# Patient Record
Sex: Male | Born: 1990 | Race: Black or African American | Hispanic: No | Marital: Single | Smoking: Current every day smoker
Health system: Southern US, Community
[De-identification: ages and names within clinical notes are randomized; demographics above are authoritative.]

## PROBLEM LIST (undated history)

## (undated) DIAGNOSIS — F32A Depression, unspecified: Secondary | ICD-10-CM

## (undated) DIAGNOSIS — F319 Bipolar disorder, unspecified: Secondary | ICD-10-CM

## (undated) DIAGNOSIS — F329 Major depressive disorder, single episode, unspecified: Secondary | ICD-10-CM

## (undated) HISTORY — DX: Bipolar disorder, unspecified: F31.9

---
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
## 2015-07-02 ENCOUNTER — Emergency Department (HOSPITAL_COMMUNITY): Payer: Self-pay

## 2015-07-02 ENCOUNTER — Encounter (HOSPITAL_COMMUNITY): Payer: Self-pay | Admitting: *Deleted

## 2015-07-02 ENCOUNTER — Emergency Department (HOSPITAL_COMMUNITY)
Admission: EM | Admit: 2015-07-02 | Discharge: 2015-07-03 | Disposition: A | Discharge status: Other Acute Inpatient Hospital | Payer: Self-pay | Attending: Emergency Medicine | Admitting: Emergency Medicine

## 2015-07-02 ENCOUNTER — Encounter (HOSPITAL_COMMUNITY): Payer: Self-pay | Admitting: Emergency Medicine

## 2015-07-02 ENCOUNTER — Ambulatory Visit (HOSPITAL_COMMUNITY)
Admission: RE | Admit: 2015-07-02 | Discharge: 2015-07-02 | Disposition: A | Discharge status: Home / Self Care | Payer: Self-pay | Attending: Psychiatry | Admitting: Psychiatry

## 2015-07-02 DIAGNOSIS — Y998 Other external cause status: Secondary | ICD-10-CM | POA: Insufficient documentation

## 2015-07-02 DIAGNOSIS — S0033XA Contusion of nose, initial encounter: Secondary | ICD-10-CM | POA: Insufficient documentation

## 2015-07-02 DIAGNOSIS — Y9389 Activity, other specified: Secondary | ICD-10-CM | POA: Insufficient documentation

## 2015-07-02 DIAGNOSIS — R4586 Emotional lability: Secondary | ICD-10-CM

## 2015-07-02 DIAGNOSIS — F329 Major depressive disorder, single episode, unspecified: Secondary | ICD-10-CM | POA: Insufficient documentation

## 2015-07-02 DIAGNOSIS — R45851 Suicidal ideations: Secondary | ICD-10-CM

## 2015-07-02 DIAGNOSIS — F1210 Cannabis abuse, uncomplicated: Secondary | ICD-10-CM | POA: Insufficient documentation

## 2015-07-02 DIAGNOSIS — F1010 Alcohol abuse, uncomplicated: Secondary | ICD-10-CM | POA: Insufficient documentation

## 2015-07-02 DIAGNOSIS — F1290 Cannabis use, unspecified, uncomplicated: Secondary | ICD-10-CM | POA: Insufficient documentation

## 2015-07-02 DIAGNOSIS — F3489 Other specified persistent mood disorders: Secondary | ICD-10-CM | POA: Insufficient documentation

## 2015-07-02 DIAGNOSIS — Y9289 Other specified places as the place of occurrence of the external cause: Secondary | ICD-10-CM | POA: Insufficient documentation

## 2015-07-02 DIAGNOSIS — X838XXA Intentional self-harm by other specified means, initial encounter: Secondary | ICD-10-CM | POA: Insufficient documentation

## 2015-07-02 DIAGNOSIS — S0083XA Contusion of other part of head, initial encounter: Secondary | ICD-10-CM | POA: Insufficient documentation

## 2015-07-02 DIAGNOSIS — F332 Major depressive disorder, recurrent severe without psychotic features: Secondary | ICD-10-CM | POA: Insufficient documentation

## 2015-07-02 HISTORY — DX: Depression, unspecified: F32.A

## 2015-07-02 HISTORY — DX: Major depressive disorder, single episode, unspecified: F32.9

## 2015-07-02 LAB — COMPREHENSIVE METABOLIC PANEL
ALT: 16 U/L — ABNORMAL LOW (ref 17–63)
ANION GAP: 10 (ref 5–15)
AST: 28 U/L (ref 15–41)
Albumin: 4.3 g/dL (ref 3.5–5.0)
Alkaline Phosphatase: 48 U/L (ref 38–126)
BUN: 10 mg/dL (ref 6–20)
CHLORIDE: 107 mmol/L (ref 101–111)
CO2: 23 mmol/L (ref 22–32)
Calcium: 9.1 mg/dL (ref 8.9–10.3)
Creatinine, Ser: 1.15 mg/dL (ref 0.61–1.24)
GFR calc non Af Amer: >60 mL/min (ref >60)
Glucose, Bld: 82 mg/dL (ref 65–99)
Potassium: 4.2 mmol/L (ref 3.5–5.1)
SODIUM: 140 mmol/L (ref 135–145)
Total Bilirubin: 0.9 mg/dL (ref 0.3–1.2)
Total Protein: 7.9 g/dL (ref 6.5–8.1)

## 2015-07-02 LAB — ETHANOL: Alcohol, Ethyl (B): 55 mg/dL — ABNORMAL HIGH (ref <5)

## 2015-07-02 LAB — CBC
HCT: 43 % (ref 39.0–52.0)
HEMOGLOBIN: 14.7 g/dL (ref 13.0–17.0)
MCH: 32.7 pg (ref 26.0–34.0)
MCHC: 34.2 g/dL (ref 30.0–36.0)
MCV: 95.6 fL (ref 78.0–100.0)
Platelets: 335 10*3/uL (ref 150–400)
RBC: 4.5 MIL/uL (ref 4.22–5.81)
RDW: 12.2 % (ref 11.5–15.5)
WBC: 5.7 10*3/uL (ref 4.0–10.5)

## 2015-07-02 LAB — URINE MICROSCOPIC-ADD ON

## 2015-07-02 LAB — URINALYSIS, ROUTINE W REFLEX MICROSCOPIC
BILIRUBIN URINE: NEGATIVE
Glucose, UA: NEGATIVE mg/dL
Ketones, ur: NEGATIVE mg/dL
Leukocytes, UA: NEGATIVE
NITRITE: NEGATIVE
PH: 5.5 (ref 5.0–8.0)
Protein, ur: NEGATIVE mg/dL
SPECIFIC GRAVITY, URINE: 1.027 (ref 1.005–1.030)
Urobilinogen, UA: 1 mg/dL (ref 0.0–1.0)

## 2015-07-02 LAB — RAPID URINE DRUG SCREEN, HOSP PERFORMED
AMPHETAMINES: NOT DETECTED
Barbiturates: NOT DETECTED
Benzodiazepines: NOT DETECTED
COCAINE: NOT DETECTED
OPIATES: NOT DETECTED
TETRAHYDROCANNABINOL: POSITIVE — AB

## 2015-07-02 LAB — SALICYLATE LEVEL

## 2015-07-02 LAB — ACETAMINOPHEN LEVEL

## 2015-07-02 MED ORDER — IBUPROFEN 200 MG PO TABS
600.0000 mg | ORAL_TABLET | Freq: Four times a day (QID) | ORAL | Status: DC | PRN
  Administered 2015-07-02 – 2015-07-03 (×2): 600 mg via ORAL
  Filled 2015-07-02 (×2): qty 3

## 2015-07-02 MED ORDER — IBUPROFEN 200 MG PO TABS: 600.0000 mg | ORAL_TABLET | Freq: Three times a day (TID) | ORAL | Status: DC | PRN

## 2015-07-02 MED ORDER — ACETAMINOPHEN 325 MG PO TABS: 650.0000 mg | ORAL_TABLET | ORAL | Status: DC | PRN

## 2015-07-02 MED ORDER — ZOLPIDEM TARTRATE 5 MG PO TABS: 5.0000 mg | ORAL_TABLET | Freq: Every evening | ORAL | Status: DC | PRN

## 2015-07-02 MED ORDER — ONDANSETRON HCL 4 MG PO TABS
4.0000 mg | ORAL_TABLET | Freq: Three times a day (TID) | ORAL | Status: DC | PRN
Start: 2015-07-02 — End: 2015-07-03

## 2015-07-02 MED ORDER — LORAZEPAM 1 MG PO TABS: 1.0000 mg | ORAL_TABLET | Freq: Three times a day (TID) | ORAL | Status: DC | PRN

## 2015-07-02 MED ORDER — ALUM & MAG HYDROXIDE-SIMETH 200-200-20 MG/5ML PO SUSP
30.0000 mL | ORAL | Status: DC | PRN
Start: 2015-07-02 — End: 2015-07-03

## 2015-07-02 MED ORDER — NICOTINE 21 MG/24HR TD PT24
21.0000 mg | MEDICATED_PATCH | Freq: Every day | TRANSDERMAL | Status: DC
  Administered 2015-07-03: 21 mg via TRANSDERMAL
  Filled 2015-07-02: qty 1

## 2015-07-02 NOTE — BH Assessment (Signed)
Tele Assessment Note   Darrell [name] is a 24 y.o. male who presents as a walk in to [facility], accompanied by his mother and sister. Pt is crying and has poor eye contact, stating that he is stressed because he is unemployed, has financial problems, has legal problems and lives with his girlfriend and cannot provide for her.  Pt states he and his girlfriend had a discussion today that was emotional and he lost control.  He says that he started smashing his face against the wall several times to harm himself and cut his hand--"I didn't know I did it until I felt it".  Pt admits SI thoughts "off and on" since he was 24 yrs old, he says he has no specific plan in mind but says he doesn't trust himself because he's not sure what he will do.  Pt states that he wants to be safe for his girlfriend. Pt says he is also sad about his parents divorce and that his girlfriend had an abortion and feels like he should have stopped he.  Pt denies HI/AVH, he says he drinks alcohol at least 3-6 24oz beers, 2-5 days a week and smokes a varied amount of marijuana weekly.  His last use was few days ago.    Per pt.'s mother and sister the confirm that pt.'s mood has worsened and they witnessed him attempting to hang himself approx 1 yr ago.  Pt.'s mother is crying and apologizing because she didn't seek help sooner for her son.  Pt.'s mother/sister say when pt gets very angry, he lashes out by breaking things.  Pt says that he tends to repeat his mistakes and doesn't learn from them and told this writer he couldn't look up because he was ashamed of himself. This Clinical research associate discussed disposition with Donell Sievert, PA who recommends medical clearance and inpt admission.  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 Depression     No past surgical history on  file.  Family History: No family history on file.  Social History:  reports that he drinks alcohol. He reports that he uses illicit drugs (Marijuana). His tobacco history is not on file.  Additional Social History:  Alcohol / Drug Use Pain Medications: None  Prescriptions: None Over the Counter: None  History of alcohol / drug use?: Yes Longest period of sobriety (when/how long): None  Negative Consequences of Use: Work / Programmer, multimedia, Copywriter, advertising relationships, Armed forces operational officer, Surveyor, quantity Withdrawal Symptoms: Other (Comment) (No w/d sxs ) Substance #1 Name of Substance 1: Alcohol  1 - Age of First Use: Teens  1 - Amount (size/oz): 3-6 24oz 1 - Frequency: 2-5 Days a Week  1 - Duration: On-going  1 - Last Use / Amount: 07/02/15 Substance #2 Name of Substance 2: THC  2 - Age of First Use: Teens  2 - Amount (size/oz): Varies  2 - Frequency: "Off and On" 2 - Duration: On-going  2 - Last Use / Amount: "A few days ago"  CIWA:   COWS:    PATIENT STRENGTHS: (choose at least two) Manufacturing systems engineer Motivation for treatment/growth Supportive family/friends  Allergies: No Known Allergies  Home Medications:  (Not in a hospital admission)  OB/GYN Status:  No LMP for male patient.  General Assessment Data Location of Assessment: [facility] TTS Assessment: In system Is this a Tele or Face-to-Face Assessment?:  Face-to-Face Is this an Initial Assessment or a Re-assessment for this encounter?: Initial Assessment Marital status: Single Maiden name: None  Is patient pregnant?: No Pregnancy Status: No Living Arrangements: Non-relatives/Friends (Lives with girlfriend ) Can pt return to current living arrangement?: Yes Admission Status: Voluntary Is patient capable of signing voluntary admission?: Yes Referral Source: Self/Family/Friend Insurance type: SP  Medical Screening Exam [facility] Walk-in ONLY) Medical Exam completed: No Reason for MSE not completed: Other: ([facility] )  Crisis Care Plan Living Arrangements: Non-relatives/Friends (Lives with girlfriend ) Name of Psychiatrist: None  Name of Therapist: None   Education Status Is patient currently in school?: No Current Grade: None  Highest grade of school patient has completed: None  Name of school: None  Contact person: None   Risk to self with the past 6 months Suicidal Ideation: Yes-Currently Present Has patient been a risk to self within the past 6 months prior to admission? : Yes Suicidal Intent: No-Not Currently/Within Last 6 Months Has patient had any suicidal intent within the past 6 months prior to admission? : No Is patient at risk for suicide?: Yes Suicidal Plan?: No-Not Currently/Within Last 6 Months Has patient had any suicidal plan within the past 6 months prior to admission? : No Access to Means: Yes Specify Access to Suicidal Means: Sharps, Belts  What has been your use of drugs/alcohol within the last 12 months?: Pt using alcohol and THC  Previous Attempts/Gestures: Yes How many times?: 1 Other Self Harm Risks: None  Triggers for Past Attempts: Other personal contacts, Unpredictable Intentional Self Injurious Behavior: Damaging Comment - Self Injurious Behavior: Per pt and mom, he hots things that cause physical damage to his hands  Family Suicide History: No Recent stressful life event(s): Conflict (Comment), Job Loss, Financial Problems, Legal Issues (Pls see EPIC note ) Persecutory voices/beliefs?: No Depression: Yes Depression Symptoms: Tearfulness, Isolating, Insomnia, Loss of interest in usual pleasures, Feeling worthless/self pity, Feeling angry/irritable Substance abuse history and/or treatment for substance abuse?: Yes Suicide prevention information given to non-admitted patients: Not applicable  Risk to Others within the past 6 months Homicidal Ideation: No Does patient have any lifetime risk of violence toward others beyond the six months prior to admission?  : No Thoughts of Harm to Others: No Current Homicidal Intent: No Current Homicidal Plan: No Access to Homicidal Means: No Identified Victim: None  History of harm to others?: No Assessment of Violence: None Noted Violent Behavior Description: Nnoe  Does patient have access to weapons?: No Criminal Charges Pending?: No Does patient have a court date: No Is patient on probation?: No  Psychosis Hallucinations: None noted Delusions: None noted  Mental Status Report Appearance/Hygiene: Disheveled Eye Contact: Poor Motor Activity: Unremarkable Speech: Logical/coherent, Soft Level of Consciousness: Alert, Crying Mood: Depressed, Sad, Helpless Affect: Depressed, Sad, Flat Anxiety Level: None Thought Processes: Coherent, Relevant Judgement: Impaired Orientation: Person, Place, Time, Situation Obsessive Compulsive Thoughts/Behaviors: None  Cognitive Functioning Concentration: Normal Memory: Recent Intact, Remote Intact IQ: Average Insight: Poor Impulse Control: Poor Appetite: Fair Weight Loss: 0 Weight Gain: 0 Sleep: Decreased Total Hours of Sleep:  (2 hrs in the last 24 hrs ) Vegetative Symptoms: None  ADLScreening [facility] Assessment Services) Patient's cognitive ability adequate to safely complete daily activities?: Yes Patient able to express need for assistance with ADLs?: Yes Independently performs ADLs?: Yes (appropriate for developmental age)  Prior Inpatient Therapy Prior Inpatient Therapy: No Prior Therapy Dates: None  Prior Therapy Facilty/Provider(s): None  Reason for  Treatment: None   Prior Outpatient Therapy Prior Outpatient Therapy: No Prior Therapy Dates: None  Prior Therapy Facilty/Provider(s): None  Reason for Treatment: None  Does patient have an ACCT team?: No Does patient have Intensive In-House Services?  : No Does patient have [facility] services? : No Does patient have P4CC services?: No  ADL Screening (condition at time of  admission) Patient's cognitive ability adequate to safely complete daily activities?: Yes Is the patient deaf or have difficulty hearing?: No Does the patient have difficulty seeing, even when wearing glasses/contacts?: No Does the patient have difficulty concentrating, remembering, or making decisions?: No Patient able to express need for assistance with ADLs?: Yes Does the patient have difficulty dressing or bathing?: No Independently performs ADLs?: Yes (appropriate for developmental age) Does the patient have difficulty walking or climbing stairs?: No Weakness of Legs: None Weakness of Arms/Hands: None  Home Assistive Devices/Equipment Home Assistive Devices/Equipment: None  Therapy Consults (therapy consults require a physician order) PT Evaluation Needed: No OT Evalulation Needed: No SLP Evaluation Needed: No Abuse/Neglect Assessment (Assessment to be complete while patient is alone) Physical Abuse: Denies Verbal Abuse: Denies Sexual Abuse: Denies Exploitation of patient/patient's resources: Denies Self-Neglect: Denies Values / Beliefs Cultural Requests During Hospitalization: None Spiritual Requests During Hospitalization: None Consults Spiritual Care Consult Needed: No Social Work Consult Needed: No Merchant navy officer (For Healthcare) Does patient have an advance directive?: No Would patient like information on creating an advanced directive?: No - patient declined information    Additional Information 1:1 In Past 12 Months?: No CIRT Risk: No Elopement Risk: No Does patient have medical clearance?: No ([facility] )     Disposition:  Disposition Initial Assessment Completed for this Encounter: Yes Disposition of Patient: Inpatient treatment program, Referred to (Per Donell Sievert, PA meets criteria for inpt admission ) Type of inpatient treatment program: Adult Patient referred to: Other (Comment) (Per Donell Sievert, PA meets criteria  for inpt admission )  Murrell Redden 07/02/2015 9:49 PM

## 2015-07-02 NOTE — ED Notes (Signed)
Pt belonging: nike slide on, blue jeans, black shorts, black sock, black shirt, black cell phone(screen is broke)

## 2015-07-02 NOTE — ED Notes (Addendum)
Patient presents to the ED after attempting to commit suicide by ramming his face repeatedly into a wall. The patient reports that he "finally exploded" and that this was not the first time he has attempted to harm himself but that it is the "worst". He does not endorse a previous psychiatric history and took Hydrocodone for a series of sports injuries and Adderall. Patient drinks 2 to 5 times a week and smokes marijuana socially. He currently lives with girlfriend and has lived there for three years. He currently wishes to figure out why he keeps causing harm to himself even though he doesn't want to cause others pain.   Patient contracts for safety during inpatient stay, denies HI and says he has pain rated a 6/10 in his nose where he has an abrasion. He also has an abrasion on his left hand. Nurse gave ibuprofen 600 mg for pain. Patient appeared depressed and sad upon assessment with minimal eye contact. Patient does not appear to be in distress at this time and has no other complaints. Every 15 minute checks completed for safety.

## 2015-07-02 NOTE — ED Provider Notes (Signed)
CSN: [id number]     Arrival date & time 07/02/15  2108 History  By signing my name below, I, Darrell [name], attest that this documentation has been prepared under the direction and in the presence of Darrell Pel, PA-C. Electronically Signed: Octavia [name], ED Scribe. 07/02/2015. 9:58 PM.    Chief Complaint  Patient presents with  . Suicidal     The history is provided by the patient. No language interpreter was used.   HPI Comments: Darrell [name] is a 24 y.o. male who presents to the Emergency Department complaining of constant, gradual worsening depression with associated SI onset around 6am this morning. Pt states he has been more depressed than usual lately. He reports he smashed his face into a wall multiple times out of anger. He reports there was a discussion between him and his girlfriend today that led him to become very frustrated and he started hurting himself. He notes he has never done something like this before to this extent, but his mom says that his mother states that the pt has been "angry all of his life". She states pt has been doing this since he was younger and he has always wanted to harm himself and kill himself when he was younger. The mom tells [name] that she realized she should have got him help sooner.  She reports frequently walking in on him in different rooms trying to hang himself. After she or his sister would get the rope from around his neck, they would let the patient go play video games. Mom is sincere and tearful. She has an accent from another country and reports she generally thought he would get better and did not know what to do.   Pt reports he feels incredibly happy one day and then the next he feels like it could be the end of the world. He notes talking to a counselor multiple times (5 visits at therapist at [facility]) but reports it did not last very long. He has been given the number to the suicide hotline but has yet to use it.He reports etoh use 2-5x a  week, recreational marijuana occasionally but not today. He denies HI. Denies hallucinations or delusions. Pt also cut his left hand. He is here voluntarily. Was sent from [facility] for Medical clearance.  Past Medical History  Diagnosis Date  . Depression    History reviewed. No pertinent past surgical history. History reviewed. No pertinent family history. Social History  Substance Use Topics  . Smoking status: None  . Smokeless tobacco: None  . Alcohol Use: Yes     Comment: Drinks 2-5 days a week    Review of Systems  HENT: Positive for facial swelling.   Psychiatric/Behavioral: Positive for suicidal ideas.  All other systems reviewed and are negative.     Allergies  Review of patient's allergies indicates no known allergies.  Home Medications   Prior to Admission medications   Medication Sig Start Date End Date Taking? Authorizing Provider  ibuprofen (ADVIL,MOTRIN) 200 MG tablet Take 400 mg by mouth every 6 (six) hours as needed for moderate pain.   Yes Historical Provider, MD   Triage vitals: BP 131/78 mmHg  Pulse 79  Temp(Src) 98.1 F (36.7 C) (Oral)  Resp 20  Ht  (1.905 m)  Wt 220 lb (99.791 kg)  BMI 27.50 kg/m2  SpO2 100% Physical Exam  Constitutional: He appears well-developed and well-nourished. No distress.  HENT:  Head: Normocephalic. Head is with abrasion and with contusion.  Head is without raccoon's eyes, without Battle's sign, without laceration, without right periorbital erythema and without left periorbital erythema. Hair is normal.  Right Ear: Tympanic membrane and ear canal normal.  Left Ear: Tympanic membrane and ear canal normal.  Nose: Sinus tenderness (significant swelling to nasal bridge) present. No septal deviation or nasal septal hematoma. No epistaxis. Right sinus exhibits frontal sinus tenderness. Right sinus exhibits no maxillary sinus tenderness. Left sinus exhibits frontal sinus tenderness. Left sinus exhibits no maxillary sinus  tenderness.  Mouth/Throat: Uvula is midline and oropharynx is clear and moist.  Eyes: Right eye exhibits no discharge. Left eye exhibits no discharge.  Neck: No spinous process tenderness and no muscular tenderness present.  Pulmonary/Chest: Effort normal. No respiratory distress.  Neurological: He is alert. Coordination normal.  Skin: No rash noted. He is not diaphoretic.  Psychiatric: His speech is normal and behavior is normal. He is not actively hallucinating. Thought content is not paranoid and not delusional. He exhibits a depressed mood. He expresses suicidal ideation. He expresses no homicidal ideation. He expresses suicidal plans. He expresses no homicidal plans.  Nursing note and vitals reviewed.   ED Course  Procedures  DIAGNOSTIC STUDIES: Oxygen Saturation is 100% on RA, normal by my interpretation.  COORDINATION OF CARE:  9:53 PM Discussed treatment plan which includes imaging of face with pt at bedside and pt agreed to plan.  Labs Review Labs Reviewed  COMPREHENSIVE METABOLIC PANEL - Abnormal; Notable for the following:    ALT 16 (*)    All other components within normal limits  ETHANOL - Abnormal; Notable for the following:    Alcohol, Ethyl (B) 55 (*)    All other components within normal limits  ACETAMINOPHEN LEVEL - Abnormal; Notable for the following:    Acetaminophen (Tylenol), Serum <10 (*)    All other components within normal limits  SALICYLATE LEVEL  CBC  URINE RAPID DRUG SCREEN, HOSP PERFORMED  URINALYSIS, ROUTINE W REFLEX MICROSCOPIC (NOT AT [facility])    Imaging Review No results found. I have personally reviewed and evaluated these images and lab results as part of my medical decision-making.   EKG Interpretation None      MDM   Final diagnoses:  Mood swings (HCC)  Suicidal ideation  Self-harm, initial encounter  Nasal contusion, initial encounter    10: 32 pm Psych holding orders placed Home meds reviewed and ordered as appropriate TTS  consult ordered Considered CIWA protocol and ordered when appropriate. Labs resulted showing mldly elevated ETOH. No UDS yet but otherwise unremarkable. CT maxfac pending  Filed Vitals:   07/02/15 2119  BP: 131/78  Pulse: 79  Temp: 98.1 F (36.7 C)  Resp: 20   11: 45 pm - medically cleared. Anticipate inpatient.   Darrell Peliffany Elysia Grand, PA-C 07/02/15 2345  Lorre NickAnthony Allen, MD 07/04/15 [phone]

## 2015-07-02 NOTE — ED Notes (Signed)
Patient says he feels depressed and he do not know how. Patient stated he smashed his face in the wall today because he was trying make hisself die. Patient mother says he is always trying to do this and she do not know why either.

## 2015-07-02 NOTE — ED Notes (Signed)
Patient requested to urinate. 

## 2015-07-03 ENCOUNTER — Inpatient Hospital Stay (HOSPITAL_COMMUNITY)
Admission: AD | Admit: 2015-07-03 | Discharge: 2015-07-13 | DRG: 885 | Disposition: A | Discharge status: Home / Self Care | Payer: Federal, State, Local not specified - Other | Source: Intra-hospital | Attending: Psychiatry | Admitting: Psychiatry

## 2015-07-03 ENCOUNTER — Encounter (HOSPITAL_COMMUNITY): Payer: Self-pay

## 2015-07-03 DIAGNOSIS — F1020 Alcohol dependence, uncomplicated: Secondary | ICD-10-CM | POA: Diagnosis present

## 2015-07-03 DIAGNOSIS — R45851 Suicidal ideations: Secondary | ICD-10-CM | POA: Diagnosis present

## 2015-07-03 DIAGNOSIS — F332 Major depressive disorder, recurrent severe without psychotic features: Secondary | ICD-10-CM | POA: Diagnosis present

## 2015-07-03 DIAGNOSIS — F17210 Nicotine dependence, cigarettes, uncomplicated: Secondary | ICD-10-CM | POA: Diagnosis present

## 2015-07-03 DIAGNOSIS — F322 Major depressive disorder, single episode, severe without psychotic features: Secondary | ICD-10-CM | POA: Diagnosis present

## 2015-07-03 DIAGNOSIS — F1010 Alcohol abuse, uncomplicated: Secondary | ICD-10-CM | POA: Diagnosis not present

## 2015-07-03 MED ORDER — ONDANSETRON HCL 4 MG PO TABS: 4.0000 mg | ORAL_TABLET | Freq: Three times a day (TID) | ORAL | Status: DC | PRN

## 2015-07-03 MED ORDER — ACETAMINOPHEN 325 MG PO TABS
650.0000 mg | ORAL_TABLET | ORAL | Status: DC | PRN
  Administered 2015-07-03 – 2015-07-07 (×3): 650 mg via ORAL
  Filled 2015-07-03 (×3): qty 2

## 2015-07-03 MED ORDER — NICOTINE 21 MG/24HR TD PT24
21.0000 mg | MEDICATED_PATCH | Freq: Every day | TRANSDERMAL | Status: DC
  Administered 2015-07-04 – 2015-07-13 (×11): 21 mg via TRANSDERMAL
  Filled 2015-07-03 (×13): qty 1

## 2015-07-03 MED ORDER — ZOLPIDEM TARTRATE 5 MG PO TABS: 5.0000 mg | ORAL_TABLET | Freq: Every evening | ORAL | Status: DC | PRN

## 2015-07-03 MED ORDER — IBUPROFEN 600 MG PO TABS: 600.0000 mg | ORAL_TABLET | Freq: Three times a day (TID) | ORAL | Status: DC | PRN

## 2015-07-03 MED ORDER — IBUPROFEN 600 MG PO TABS
600.0000 mg | ORAL_TABLET | Freq: Four times a day (QID) | ORAL | Status: DC | PRN
  Administered 2015-07-03 – 2015-07-09 (×11): 600 mg via ORAL
  Filled 2015-07-03 (×11): qty 1

## 2015-07-03 MED ORDER — LORAZEPAM 1 MG PO TABS
1.0000 mg | ORAL_TABLET | Freq: Three times a day (TID) | ORAL | Status: DC | PRN
  Administered 2015-07-03 – 2015-07-04 (×2): 1 mg via ORAL
  Filled 2015-07-03 (×2): qty 1

## 2015-07-03 MED ORDER — TRAZODONE HCL 50 MG PO TABS
50.0000 mg | ORAL_TABLET | Freq: Every evening | ORAL | Status: DC | PRN
  Administered 2015-07-03 – 2015-07-04 (×2): 50 mg via ORAL
  Filled 2015-07-03 (×2): qty 1

## 2015-07-03 MED ORDER — ALUM & MAG HYDROXIDE-SIMETH 200-200-20 MG/5ML PO SUSP: 30.0000 mL | ORAL | Status: DC | PRN

## 2015-07-03 NOTE — Progress Notes (Signed)
Adult Psychoeducational Group Note  Date:  07/03/2015 Time:  9:36 PM  Group Topic/Focus:  Wrap-Up Group:   The focus of this group is to help patients review their daily goal of treatment and discuss progress on daily workbooks.  Participation Level:  Active  Participation Quality:  Appropriate and Attentive  Affect:  Appropriate  Cognitive:  Appropriate  Insight: Appropriate  Engagement in Group:  Engaged  Modes of Intervention:  Discussion  Additional Comments:  Pt stated his day was a 5/10. Pt stated his goal for today was to tell people what was wrong instead of just keeping it inside. Pt stated his positive thing for today was that he was able to see his mother. Caswell CorwinOwen, Jaccob Czaplicki C 07/03/2015, 9:36 PM

## 2015-07-03 NOTE — Progress Notes (Signed)
Admit to the adult unit from obs: Documents and policies reviewed with pt. Pt verbalized understanding of polices and unit rules. Pt stated that he was stressed and banged his head on the wall at least four times. Pt noted to have lateral scrapes to nose;cut to upper lip & L inner wrist. Pt anxious on admit. Pt reported pain to bridge of nose. Pt given Ativan for anxiety and Motrin for pain. Pt is a very polite young man. Pt presents with flat affect and depressed mood. Pt appears sad and hopeless. Pt denies suicidal thoughts at this time. Pt verbally contracts for safety. Pt denies withdrawal symptoms.

## 2015-07-03 NOTE — BH Assessment (Signed)
[facility] Assessment Progress Note  Per Thedore MinsMojeed Akintayo, MD, this pt [facility].  [name], RN, [facility] to Rm Obs 4.  Pt has signed Voluntary Admission and Consent for Treatment, as well as Consent to Release Information to no one, and signed [facility].  Pt's nurse has been notified, and agrees to send original paperwork along with pt via [employer], and to call report to [phone] or [phone].  Doylene Canninghomas Sydelle Sherfield, MA Triage Specialist [phone]

## 2015-07-03 NOTE — Progress Notes (Signed)
Nursing Admission Assessment:  Patient is Voluntary Admission to Observation Unit after presenting to [facility] accompanied by his Mother and Sister. Patient admits to longtime depression since his teens with SI "off and on". A "heated discussion" between he and his girlfriend, with whom he lives, precipitated this admission, as patient began banging his face against the wall. Patient does has lateral scrapes across the bridge of his nose, a 1 cm cut to his right upper lip and an approximately 3/4 inch laceration to his left inner wrist covered with a bandaid. No site has active drainage or edema. Patient admits to rages where he destroys property, both when intoxicated and when sober according to patient. Patient admits to drinking beer 3 to 5 days a week, 1 to 6 beers each day. Patient states he rarely drinks more than his regular amount, and does smoke cannabis about once a week. Patient is cooperative, quiet, states he is not comfortable expressing his feelings or emotions even to his girlfriend. Patient currently denies any SI/HI/AVH and verbally contracts for safety regarding SI and also for any rages and/or impulse to destroy property. Q 15 minute checks for safety continuous.

## 2015-07-03 NOTE — ED Notes (Addendum)
[facility]. Denies SI/HI/AVH. Discharged with [facility] and belongings given to [employer]. [facility]. Denies pain.

## 2015-07-04 DIAGNOSIS — F332 Major depressive disorder, recurrent severe without psychotic features: Principal | ICD-10-CM

## 2015-07-04 DIAGNOSIS — F1010 Alcohol abuse, uncomplicated: Secondary | ICD-10-CM

## 2015-07-04 MED ORDER — FLUOXETINE HCL 20 MG PO CAPS
20.0000 mg | ORAL_CAPSULE | Freq: Every day | ORAL | Status: DC
  Administered 2015-07-04 – 2015-07-12 (×9): 20 mg via ORAL
  Filled 2015-07-04 (×12): qty 1

## 2015-07-04 MED ORDER — LORAZEPAM 1 MG PO TABS
1.0000 mg | ORAL_TABLET | Freq: Three times a day (TID) | ORAL | Status: DC | PRN
  Administered 2015-07-04 – 2015-07-10 (×12): 1 mg via ORAL
  Filled 2015-07-04 (×12): qty 1

## 2015-07-04 NOTE — Progress Notes (Signed)
D: Darrell [name] states his goal for today was to see his family, talk to more people on the unit and not be so isolative. He was able to accomplish his goals. He rates Anxiety 5/10 Depression 4/10 Anger 0/10. He denies SI/HI/AVH and contracts for safety.  A: Encouragement and support given.  R: Continue to monitor for patient safety and medication effectiveness.

## 2015-07-04 NOTE — H&P (Signed)
Psychiatric Admission Assessment Adult  Patient Identification: Darrell [name] MRN:  [id number] Date of Evaluation:  07/04/2015 Chief Complaint:  " I smashed my face because I was very upset" Principal Diagnosis: Major Depression, Severe, no Psychotic symptoms  Diagnosis:   Patient Active Problem List   Diagnosis Date Noted  . Alcohol use disorder, severe, dependence ([place]) [F10.20] 07/03/2015   History of Present Illness:: 24 year old man, states he recently had an argument with his GF , and states " I don't really remember much of it ". States that he felt increasingly upset and angry and  states " I ended up smashing my face against the wall a few times ". (  Patient has  some bruises and lacerations on face ) . Patient states he also had some passive suicidal ideations, wishing he were dead .  He later told his mother, who was concerned about this event, and was brought to the hospital. Patient attributes his agitation and explosiveness to insomnia , and states he had not slept well for  A few days due to depression and anxiety. Patient describes a history of chronic depression, and states he has felt depressed for several years .   Patient also describes a history of alcohol abuse and states he has been drinking 4-5 times per week, at times to gross intoxication. Patient states, however, that his alcohol consumption pattern has decreased and that he is drinking much less than before- he states alcohol " is a way for me to self medicate" to manage anxiety and depression.   Associated Signs/Symptoms: Depression Symptoms:  depressed mood, anhedonia, insomnia, recurrent thoughts of death, anxiety, loss of energy/fatigue, decreased appetite, reports low self esteem (Hypo) Manic Symptoms:  Does not currently endorse symptoms of mania or hypomania- states does have brief episodes of feeling " hyper" usually lasting only a few minutes  Anxiety Symptoms:  States he worries excessively ,  particularly about  Relationships, finances  Psychotic Symptoms:  Denies psychotic symptoms PTSD Symptoms: Does not endorse  Total Time spent with patient: 45 minutes  Past Psychiatric History: this is first psychiatric admission, states he wrapped a cord around neck 4-5 years ago, but " could not go through it".  Denies any history of self cutting. Describes excessive anxiety, related to psychosocial stressors. Describes occasional panic attacks. No psychosis, no clear history of mania/hypomania. Denies any history of violence. States he has not been on any psychiatric medications .   Risk to Self: Is patient at risk for suicide?: No What has been your use of drugs/alcohol within the last 12 months?: Pt reports that he is drinking 2-5 days a week, 2-6 22oz beers at that time; recreational use of THC Risk to Others:   Prior Inpatient Therapy:   Prior Outpatient Therapy:    Alcohol Screening: 1. How often do you have a drink containing alcohol?: 4 or more times a week 2. How many drinks containing alcohol do you have on a typical day when you are drinking?: 5 or 6 3. How often do you have six or more drinks on one occasion?: Monthly Preliminary Score: 4 4. How often during the last year have you found that you were not able to stop drinking once you had started?: Less than monthly 5. How often during the last year have you failed to do what was normally expected from you becasue of drinking?: Never 6. How often during the last year have you needed a first drink in the morning to  get yourself going after a heavy drinking session?: Never 7. How often during the last year have you had a feeling of guilt of remorse after drinking?: Weekly 8. How often during the last year have you been unable to remember what happened the night before because you had been drinking?: Less than monthly 9. Have you or someone else been injured as a result of your drinking?: Yes, during the last year (Patient's  tendency is to destroy objects which he often incurs an injury when engaging in this destruction.) 10. Has a relative or friend or a doctor or another health worker been concerned about your drinking or suggested you cut down?: Yes, during the last year Alcohol Use Disorder Identification Test Final Score (AUDIT): 21 Brief Intervention: Yes Substance Abuse History in the last 12 months:   Describes history of alcohol abuse but states " I have slowed down a lot ", and now drinks 4-5 x per week, usually 1-5 beers . Smokes cannabis 1-2 per week.  Denies any other drug abuse . Consequences of Substance Abuse: (+) blackouts in the past, history of legal difficulties for selling liquor to a minor in the past . No DUI, no history of seizures Previous Psychotropic Medications: Denies  Psychological Evaluations: No  Past Medical History: Denies medical issues , NKDA, smokes 1/2 PPD  Past Medical History  Diagnosis Date  . Depression    History reviewed. No pertinent past surgical history. Family History:  Parents alive, divorced, has an improved relationship with father,  Two sisters. Family Psychiatric  History:  Mother has history of alcohol abuse in the past, states some family members have depression, denies suicides in family.  Social History:  Single, no children, lives with GF, currently unemployed, no current source of income , has some legal issues, related to " paying court costs " in order to get license .  History  Alcohol Use  . Yes    Comment: Drinks 2-5 days a week     History  Drug Use  . Yes  . Special: Marijuana    Comment: Uses "off and on"    Social History   Social History  . Marital Status: Single    Spouse Name: N/A  . Number of Children: N/A  . Years of Education: N/A   Social History Main Topics  . Smoking status: Current Every Day Smoker -- 1.00 packs/day    Types: Cigarettes  . Smokeless tobacco: None  . Alcohol Use: Yes     Comment: Drinks 2-5 days a week   . Drug Use: Yes    Special: Marijuana     Comment: Uses "off and on"  . Sexual Activity: Not Asked   Other Topics Concern  . None   Social History Narrative   Additional Social History:    Pain Medications: n/a Prescriptions: n/a Over the Counter: n/a History of alcohol / drug use?: Yes Longest period of sobriety (when/how long): 8 or 9 months in 2013 Negative Consequences of Use: Personal relationships, Financial Withdrawal Symptoms: Irritability, Aggressive/Assaultive ("If it's been a couple of days and I haven't had anything to drinkn") Name of Substance 1: Beer 1 - Age of First Use: 11  years 1 - Amount (size/oz): Equivalent of up to 6 beers per day 1 - Frequency: 2 to 5 days per week 1 - Duration: Several hours, mostly at night 1 - Last Use / Amount: 07/02/2015 Name of Substance 2: Cannabis 2 - Age of First Use: approx 24 years  old 2 - Amount (size/oz): States only "casually" if a friend has some weed; that he never purchases it 2 - Frequency: Approximately once a week 2 - Duration: A few hours or hits? 2 - Last Use / Amount: "A few days ago".  Allergies:  No Known Allergies Lab Results:  Results for orders placed or performed during the hospital encounter of 07/02/15 (from the past 48 hour(s))  Comprehensive metabolic panel     Status: Abnormal   Collection Time: 07/02/15  9:37 PM  Result Value Ref Range   Sodium 140 135 - 145 mmol/L   Potassium 4.2 3.5 - 5.1 mmol/L   Chloride 107 101 - 111 mmol/L   CO2 23 22 - 32 mmol/L   Glucose, Bld 82 65 - 99 mg/dL   BUN 10 6 - 20 mg/dL   Creatinine, Ser 1.15 0.61 - 1.24 mg/dL   Calcium 9.1 8.9 - 10.3 mg/dL   Total Protein 7.9 6.5 - 8.1 g/dL   Albumin 4.3 3.5 - 5.0 g/dL   AST 28 15 - 41 U/L   ALT 16 (L) 17 - 63 U/L   Alkaline Phosphatase 48 38 - 126 U/L   Total Bilirubin 0.9 0.3 - 1.2 mg/dL   GFR calc non Af Amer >60 >60 mL/min   GFR calc Af Amer >60 >60 mL/min    Comment: (NOTE) The eGFR has been calculated using  the CKD EPI equation. This calculation has not been validated in all clinical situations. eGFR's persistently <60 mL/min signify possible Chronic Kidney Disease.    Anion gap 10 5 - 15  CBC     Status: None   Collection Time: 07/02/15  9:37 PM  Result Value Ref Range   WBC 5.7 4.0 - 10.5 K/uL   RBC 4.50 4.22 - 5.81 MIL/uL   Hemoglobin 14.7 13.0 - 17.0 g/dL   HCT 43.0 39.0 - 52.0 %   MCV 95.6 78.0 - 100.0 fL   MCH 32.7 26.0 - 34.0 pg   MCHC 34.2 30.0 - 36.0 g/dL   RDW 12.2 11.5 - 15.5 %   Platelets 335 150 - 400 K/uL  Ethanol (ETOH)     Status: Abnormal   Collection Time: 07/02/15  9:38 PM  Result Value Ref Range   Alcohol, Ethyl (B) 55 (H) <5 mg/dL    Comment:        LOWEST DETECTABLE LIMIT FOR SERUM ALCOHOL IS 5 mg/dL FOR MEDICAL PURPOSES ONLY   Salicylate level     Status: None   Collection Time: 07/02/15  9:38 PM  Result Value Ref Range   Salicylate Lvl <6.2 2.8 - 30.0 mg/dL  Acetaminophen level     Status: Abnormal   Collection Time: 07/02/15  9:38 PM  Result Value Ref Range   Acetaminophen (Tylenol), Serum <10 (L) 10 - 30 ug/mL    Comment:        THERAPEUTIC CONCENTRATIONS VARY SIGNIFICANTLY. A RANGE OF 10-30 ug/mL MAY BE AN EFFECTIVE CONCENTRATION FOR MANY PATIENTS. HOWEVER, SOME ARE BEST TREATED AT CONCENTRATIONS OUTSIDE THIS RANGE. ACETAMINOPHEN CONCENTRATIONS >150 ug/mL AT 4 HOURS AFTER INGESTION AND >50 ug/mL AT 12 HOURS AFTER INGESTION ARE OFTEN ASSOCIATED WITH TOXIC REACTIONS.   Urine rapid drug screen (hosp performed) (Not at [facility])     Status: Abnormal   Collection Time: 07/02/15 10:40 PM  Result Value Ref Range   Opiates NONE DETECTED NONE DETECTED   Cocaine NONE DETECTED NONE DETECTED   Benzodiazepines NONE DETECTED NONE DETECTED  Amphetamines NONE DETECTED NONE DETECTED   Tetrahydrocannabinol POSITIVE (A) NONE DETECTED   Barbiturates NONE DETECTED NONE DETECTED    Comment:        DRUG SCREEN FOR MEDICAL PURPOSES ONLY.  IF CONFIRMATION IS  NEEDED FOR ANY PURPOSE, NOTIFY LAB WITHIN 5 DAYS.        LOWEST DETECTABLE LIMITS FOR URINE DRUG SCREEN Drug Class       Cutoff (ng/mL) Amphetamine      1000 Barbiturate      200 Benzodiazepine   025 Tricyclics       852 Opiates          300 Cocaine          300 THC              50   Urinalysis, Routine w reflex microscopic (not at [facility])     Status: Abnormal   Collection Time: 07/02/15 10:40 PM  Result Value Ref Range   Color, Urine YELLOW YELLOW   APPearance CLEAR CLEAR   Specific Gravity, Urine 1.027 1.005 - 1.030   pH 5.5 5.0 - 8.0   Glucose, UA NEGATIVE NEGATIVE mg/dL   Hgb urine dipstick SMALL (A) NEGATIVE   Bilirubin Urine NEGATIVE NEGATIVE   Ketones, ur NEGATIVE NEGATIVE mg/dL   Protein, ur NEGATIVE NEGATIVE mg/dL   Urobilinogen, UA 1.0 0.0 - 1.0 mg/dL   Nitrite NEGATIVE NEGATIVE   Leukocytes, UA NEGATIVE NEGATIVE  Urine microscopic-add on     Status: None   Collection Time: 07/02/15 10:40 PM  Result Value Ref Range   RBC / HPF 7-10 <3 RBC/hpf    Metabolic Disorder Labs:  No results found for: HGBA1C, MPG No results found for: PROLACTIN No results found for: CHOL, TRIG, HDL, CHOLHDL, VLDL, LDLCALC  Current Medications: Current Facility-Administered Medications  Medication Dose Route Frequency Provider Last Rate Last Dose  . acetaminophen (TYLENOL) tablet 650 mg  650 mg Oral Q4H PRN Delfin Gant, NP   650 mg at 07/03/15 2213  . alum & mag hydroxide-simeth (MAALOX/MYLANTA) 200-200-20 MG/5ML suspension 30 mL  30 mL Oral PRN Delfin Gant, NP      . ibuprofen (ADVIL,MOTRIN) tablet 600 mg  600 mg Oral Q6H PRN Kerrie Buffalo, NP   600 mg at 07/04/15 0827  . LORazepam (ATIVAN) tablet 1 mg  1 mg Oral Q8H PRN Delfin Gant, NP   1 mg at 07/04/15 1100  . nicotine (NICODERM CQ - dosed in mg/24 hours) patch 21 mg  21 mg Transdermal Daily Delfin Gant, NP   21 mg at 07/04/15 0749  . ondansetron (ZOFRAN) tablet 4 mg  4 mg Oral Q8H PRN Delfin Gant, NP      . traZODone (DESYREL) tablet 50 mg  50 mg Oral QHS PRN,MR X 1 Spencer E Simon, PA-C   50 mg at 07/03/15 2213  . zolpidem (AMBIEN) tablet 5 mg  5 mg Oral QHS PRN Delfin Gant, NP       PTA Medications: No prescriptions prior to admission    Musculoskeletal: Strength & Muscle Tone: within normal limits Gait & Station: normal Patient leans: N/A  Psychiatric Specialty Exam: Physical Exam  Review of Systems  Constitutional: Negative.   HENT: Negative.   Eyes: Negative.   Respiratory: Negative.   Cardiovascular: Negative.   Gastrointestinal: Negative.   Genitourinary: Negative.   Musculoskeletal: Negative.   Skin: Negative.   Neurological: Negative for seizures.  Endo/Heme/Allergies: Negative.   Psychiatric/Behavioral: Positive  for depression and suicidal ideas.  All other systems reviewed and are negative.   Blood pressure 129/80, pulse 98, temperature 97.6 F (36.4 C), temperature source Oral, resp. rate 18, height _0  (1.905 m), weight 210 lb (95.255 kg), SpO2 100 %.Body mass index is 26.25 kg/(m^2).  General Appearance: Fairly Groomed  Engineer, water::  Fair  Speech:  Normal Rate  Volume:  Decreased  Mood:  Depressed  Affect:  Constricted  Thought Process:  Linear  Orientation:  Full (Time, Place, and Person)  Thought Content:  ruminative about stressors, no hallucinations, no delusions   Suicidal Thoughts:  No at this time denies any suicidal ideations and does not endorse any plan or intention to hurt self, contracts for safety on unit.  Homicidal Thoughts:  No- denies   Memory:  recent and remote grossly intact   Judgement:  Fair  Insight:  Present  Psychomotor Activity:  Decreased- no current symptoms of alcohol WDL at present, no tremors, no diaphoresis, no agitation  Concentration:  Good  Recall:  Good  Fund of Knowledge:Good  Language: Good  Akathisia:  No  Handed:  Right  AIMS (if indicated):     Assets:  Communication Skills Desire  for Improvement Resilience  ADL's:   Fair   Cognition: WNL  Sleep:  Number of Hours: 6.25     Treatment Plan Summary: Daily contact with patient to assess and evaluate symptoms and progress in treatment, Medication management, Plan inpatient admission and medication management as below   Observation Level/Precautions:  15 minute checks  Laboratory:   As needed - TSH   Psychotherapy:  Milieu, supportive   Medications:  Ativan PRNs for anxiety as needed - agrees to antidepressant trial- we discussed options , agrees to PROZAC 20 mgrs QDAY - Trazodone PRNs for insomnia as needed .   Consultations:  As needed   Discharge Concerns:  -   Estimated LOS: 5-6 days   Other:     I certify that inpatient services furnished can reasonably be expected to improve the patient's condition.   Reygan Heagle 11/10/20162:42 PM

## 2015-07-04 NOTE — Progress Notes (Signed)
Pt attended the evening karaoke group. Pt participated by singing a song. 
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
## 2015-07-04 NOTE — [facility] Group Notes (Signed)
[facility] LCSW Group Therapy 07/04/2015 1:15pm  Type of Therapy: Group Therapy- Balance in Life  Participation Level: Active   Description of the Group:  The topic for group was balance in life. Today's group focused on defining balance in one's own words, identifying things that can knock one off balance, and exploring healthy ways to maintain balance in life. Group members were asked to provide an example of a time when they felt off balance, describe how they handled that situation,and process healthier ways to regain balance in the future. Group members were asked to share the most important tool for maintaining balance that they learned [facility] and how they plan to apply this method after discharge.  Summary of Patient Progress Pt participated actively in group discussion and was able to identifying having a job and getting his degree as goals that would help him achieve balance in his life. He interacted well with peers regarding substance use and finding balance.     Therapeutic Modalities:   Cognitive Behavioral Therapy Solution-Focused Therapy Assertiveness Training   Chad CordialLauren Carter, LCSWA 07/04/2015 4:07 PM

## 2015-07-04 NOTE — Progress Notes (Addendum)
D:Darrell [name]'s mom came to visit this evening and he attended group. After the visit we had a lengthy conversation. He tells me he has lots of anger issues. His mom was imprisoned for 5 years for dealing heroin. Her absence left him and his middle sister in the home to raise themselves. They were around 7016 and 26 years old. In the process of raising themselves his youngest sister who was around 9 at the time was taken away to be raised by her step father. The house that [name] and his sister lived in was eventually foreclosed and then his biological father agreed to take them in. His father eventually moved back home to [place] for 4 years and [name] was abandoned once again. Recently in his adult years he has been unable to finish his course of study for sports management due to lack of motivation. He has held down a few jobs in which he states people used him and did not pay him what he was promised. His desire is to try to get his anger management under control as well as his labile moods.  A: Encouraged [name] to attend groups. Support given.  R: Continue to monitor for patient safety and medication effectiveness.

## 2015-07-04 NOTE — [facility] Group Notes (Signed)

## 2015-07-04 NOTE — Tx Team (Signed)
Interdisciplinary Treatment Plan Update (Adult) Date: 07/04/2015   Date: 07/04/2015 3:12 PM  Progress in Treatment:  Attending groups: Yes  Participating in groups: Yes  Taking medication as prescribed: Yes  Tolerating medication: Yes  Family/Significant othe contact made: No, CSW attempting to make contact with mother Patient understands diagnosis: Yes Discussing patient identified problems/goals with staff: Yes  Medical problems stabilized or resolved: Yes  Denies suicidal/homicidal ideation: Yes Patient has not harmed self or Others: Yes   New problem(s) identified: None identified at this time.   Discharge Plan or Barriers: Pt will return home and follow-up with [facility]  Additional comments: n/a   Reason for Continuation of Hospitalization:  Depression Medication stabilization Suicidal ideation   Estimated length of stay: 3-5 days  Review of initial/current patient goals per problem list:   1.  Goal(s): Patient will participate in aftercare plan  Met:  Yes  Target date: 3-5 days from date of admission   As evidenced by: Patient will participate within aftercare plan AEB aftercare provider and housing plan at discharge being identified.   07/04/15: Pt will discharge home and follow-up with [facility] and [facility]  2.  Goal (s): Patient will exhibit decreased depressive symptoms and suicidal ideations.  Met:  Progressing  Target date: 3-5 days from date of admission   As evidenced by: Patient will utilize self rating of depression at 3 or below and demonstrate decreased signs of depression or be deemed stable for discharge by MD. 07/04/15: Pt was admitted with symptoms of depression, rating 10/10. Pt continues to present with flat affect and depressive symptoms.  Pt will demonstrate decreased symptoms of depression and rate depression at 3/10 or lower prior to discharge.  Attendees:  Patient:    Family:    Physician:  Dr. Parke Poisson, MD  07/04/2015 3:12 PM  Nursing: Lars Pinks, RN Case manager  07/04/2015 3:12 PM  Clinical Social Worker Norman Clay, MSW 07/04/2015 3:12 PM  Other: Lucinda Dell, Beverly Sessions Liasion 07/04/2015 3:12 PM  Clinical: Sandre Kitty RN and Grayland Ormond RN 07/04/2015 3:12 PM  Other: , RN Charge Nurse 07/04/2015 3:12 PM  Other:     Peri Maris, [place] MSW

## 2015-07-05 MED ORDER — TRAZODONE HCL 50 MG PO TABS
50.0000 mg | ORAL_TABLET | Freq: Every evening | ORAL | Status: DC | PRN
  Administered 2015-07-05 – 2015-07-08 (×4): 50 mg via ORAL
  Filled 2015-07-05 (×4): qty 1

## 2015-07-05 NOTE — Progress Notes (Signed)
[facility] MD Progress Note  07/05/2015 12:47 PM Darrell [name]  MRN:  [id number] Subjective:  Patient states he is feeling better today. States depression is better. States he still feels anxious . Denies medication side effects at this time. Facial abrasions healing and patient states they are less painful now. Objective : I have discussed case with treatment team and have met with patient. He presents vaguely depressed and still rather constricted in affect but he states he is feeling better, and stresses anxiety, rather than depression, as most significant symptom at this time. States he had a good visit from mother and GF today, which helped him feel more supported. Describes some AM sedation and feeling " foggy" in AM, which may be related to current medication management , particularly Trazodone.  No disruptive or agitated behaviors on unit, polite upon approach. He has been going to some groups, and has been participating in some milieu activities . We reviewed medication side effect profiles and rationales.  Of note, at this time patient is not presenting with any significant alcohol WDL symptoms- no tremors, no diaphoresis, no psychomotor agitation Principal Problem:  Major Depression, No psychotic Features, Severe- Alcohol Abuse  Diagnosis:   Patient Active Problem List   Diagnosis Date Noted  . Alcohol use disorder, severe, dependence ([place]) [F10.20] 07/03/2015   Total Time spent with patient: 20 minutes    Past Medical History:  Past Medical History  Diagnosis Date  . Depression    History reviewed. No pertinent past surgical history. Family History: History reviewed. No pertinent family history.  Social History:  History  Alcohol Use  . Yes    Comment: Drinks 2-5 days a week     History  Drug Use  . Yes  . Special: Marijuana    Comment: Uses "off and on"    Social History   Social History  . Marital Status: Single    Spouse Name: N/A  . Number of Children: N/A   . Years of Education: N/A   Social History Main Topics  . Smoking status: Current Every Day Smoker -- 1.00 packs/day    Types: Cigarettes  . Smokeless tobacco: None  . Alcohol Use: Yes     Comment: Drinks 2-5 days a week  . Drug Use: Yes    Special: Marijuana     Comment: Uses "off and on"  . Sexual Activity: Not Asked   Other Topics Concern  . None   Social History Narrative   Additional Social History:    Pain Medications: n/a Prescriptions: n/a Over the Counter: n/a History of alcohol / drug use?: Yes Longest period of sobriety (when/how long): 8 or 9 months in 2013 Negative Consequences of Use: Personal relationships, Financial Withdrawal Symptoms: Irritability, Aggressive/Assaultive ("If it's been a couple of days and I haven't had anything to drinkn") Name of Substance 1: Beer 1 - Age of First Use: 11  years 1 - Amount (size/oz): Equivalent of up to 6 beers per day 1 - Frequency: 2 to 5 days per week 1 - Duration: Several hours, mostly at night 1 - Last Use / Amount: 07/02/2015 Name of Substance 2: Cannabis 2 - Age of First Use: approx 24 years old 2 - Amount (size/oz): States only "casually" if a friend has some weed; that he never purchases it 2 - Frequency: Approximately once a week 2 - Duration: A few hours or hits? 2 - Last Use / Amount: "A few days ago".  Sleep:  Fair  Appetite:  improving  Current Medications: Current Facility-Administered Medications  Medication Dose Route Frequency Provider Last Rate Last Dose  . acetaminophen (TYLENOL) tablet 650 mg  650 mg Oral Q4H PRN Delfin Gant, NP   650 mg at 07/03/15 2213  . alum & mag hydroxide-simeth (MAALOX/MYLANTA) 200-200-20 MG/5ML suspension 30 mL  30 mL Oral PRN Delfin Gant, NP      . FLUoxetine (PROZAC) capsule 20 mg  20 mg Oral Daily Jenne Campus, MD   20 mg at 07/05/15 0752  . ibuprofen (ADVIL,MOTRIN) tablet 600 mg  600 mg Oral Q6H PRN Kerrie Buffalo, NP   600 mg at 07/05/15 0754   . LORazepam (ATIVAN) tablet 1 mg  1 mg Oral Q8H PRN Jenne Campus, MD   1 mg at 07/05/15 1104  . nicotine (NICODERM CQ - dosed in mg/24 hours) patch 21 mg  21 mg Transdermal Daily Delfin Gant, NP   21 mg at 07/05/15 0800  . ondansetron (ZOFRAN) tablet 4 mg  4 mg Oral Q8H PRN Delfin Gant, NP      . traZODone (DESYREL) tablet 50 mg  50 mg Oral QHS PRN Jenne Campus, MD        Lab Results: No results found for this or any previous visit (from the past 48 hour(s)).  Physical Findings: AIMS: Facial and Oral Movements Muscles of Facial Expression: None, normal Lips and Perioral Area: None, normal Jaw: None, normal Tongue: None, normal,Extremity Movements Upper (arms, wrists, hands, fingers): None, normal Lower (legs, knees, ankles, toes): None, normal, Trunk Movements Neck, shoulders, hips: None, normal, Overall Severity Severity of abnormal movements (highest score from questions above): None, normal Incapacitation due to abnormal movements: None, normal Patient's awareness of abnormal movements (rate only patient's report): No Awareness, Dental Status Current problems with teeth and/or dentures?: No Does patient usually wear dentures?: No  CIWA:  CIWA-Ar Total: 1 COWS:  COWS Total Score: 1  Musculoskeletal: Strength & Muscle Tone: within normal limits- no tremors , no diaphoresis, no psychomotor agitation Gait & Station: normal Patient leans: N/A  Psychiatric Specialty Exam: ROS no chest pain, no SOB, no nausea or vomiting   Blood pressure 121/70, pulse 94, temperature 97.8 F (36.6 C), temperature source Oral, resp. rate 18, height 6' 3" (1.905 m), weight 210 lb (95.255 kg), SpO2 100 %.Body mass index is 26.25 kg/(m^2).  General Appearance: Fairly Groomed  Engineer, water::  Good  Speech:  Normal Rate  Volume:  Decreased  Mood:  Depressed- but states he is feeling better today. Does report some subjective anxiety  Affect:  Constricted  Thought Process:  Goal  Directed and Linear  Orientation:  Full (Time, Place, and Person)  Thought Content:  denies hallucinations, no delusions, not internally preoccupied   Suicidal Thoughts:  No at this time denies suicidal ideations and contracts for safety on unit   Homicidal Thoughts:  No  Memory:  recent and remote grossly intact   Judgement:  Other:  improving   Insight:  improving   Psychomotor Activity:  Normal  Concentration:  Good  Recall:  Good  Fund of Knowledge:Good  Language: Good  Akathisia:  Negative  Handed:  Right  AIMS (if indicated):     Assets:  Agricultural consultant Physical Health Resilience  ADL's:  Intact  Cognition: WNL  Sleep:  Number of Hours: 5.25  Assessment - at this time patient presenting with some improvement compared to admission- states he feels better, less depressed,  but remains constricted in affect, sad. No significant alcohol WDL symptoms at this time , except for feeling subjectively anxious, but vitals are stable. Tolerating medications well thus far. Treatment Plan Summary: Daily contact with patient to assess and evaluate symptoms and progress in treatment, Medication management, Plan inpatient treatment  and medications as below  Encourage ongoing milieu, group participation to work on symptom reduction and coping skills  Continue to encourage and support abstinence, relapse prevention efforts Continue Prozac 20 mgrs QDAY for management of depression and anxiety Continue Trazodone at 50 mgrs QHS PRN for insomnia Continue Ativan 1 mgr Q 8 hour  PRNS for severe anxiety or potential alcohol WDL symptoms  COBOS, FERNANDO 07/05/2015, 12:47 PM

## 2015-07-05 NOTE — [facility] Group Notes (Signed)
[facility] LCSW Group Therapy 07/05/2015 1:15pm  Type of Therapy: Group Therapy- Feelings Around Relapse and Recovery  Participation Level: Active   Participation Quality:  Appropriate  Affect:  Appropriate  Cognitive: Alert and Oriented   Insight:  Developing   Engagement in Therapy: Developing/Improving and Engaged   Modes of Intervention: Clarification, Confrontation, Discussion, Education, Exploration, Limit-setting, Orientation, Problem-solving, Rapport Building, Dance movement psychotherapisteality Testing, Socialization and Support  Summary of Progress/Problems: The topic for today was feelings about relapse. The group discussed what relapse prevention is to them and identified triggers that they are on the path to relapse. Members also processed their feeling towards relapse and were able to relate to common experiences. Group also discussed coping skills that can be used for relapse prevention.     Therapeutic Modalities:   Cognitive Behavioral Therapy Solution-Focused Therapy Assertiveness Training Relapse Prevention Therapy    Darrell SprinklesLauren [name], LCSWA [phone] 07/05/2015 3:22 PM

## 2015-07-05 NOTE — Progress Notes (Signed)
Recreation Therapy Notes  Date: 07/05/15 Time: 930 Location: 300 Group Room  Group Topic: Stress Management  Goal Area(s) Addresses:  Patient will verbalize importance of using healthy stress management.  Patient will identify positive emotions associated with healthy stress management.   Intervention: Stress Management  Activity :  Guided Imagery.  LRT introduced the concept of guided imagery to the patients.  Patients were asked to follow along as LRT read the script to engage in the activity of guided imagery.  Education:  Stress Management, Discharge Planning.   Education Outcome: Acknowledges edcuation/In group clarification offered/Needs additional education  Clinical Observations/Feedback: Patient did not attend group.   Pallie Swigert, LRT/CTRS         Chadric Kimberley A 07/05/2015 1:43 PM 

## 2015-07-05 NOTE — [facility] Group Notes (Signed)
[facility]  07/05/2015 8:45 AM  Participation Quality: Alert, Appropriate and Oriented  Mood/Affect: Lethargic  Depression Rating: 1  Anxiety Rating: 7  Thoughts of Suicide: Pt denies SI/HI  Will you contract for safety? Yes  Current AVH: Pt denies  Plan for Discharge/Comments: Pt attended discharge planning group and actively participated in group. CSW discussed suicide prevention education with the group and encouraged them to discuss discharge planning and any relevant barriers. Pt observed to be somewhat lethargic but reports feeling "okay." Pt reports that he woke up with "cold sweats."  Transportation Means: Pt reports access to transportation  Supports: No supports mentioned at this time  Chad CordialLauren Carter, LCSWA 07/05/2015 9:25 AM

## 2015-07-05 NOTE — Progress Notes (Signed)
D [name] is seen out in the 400 milieu this morning. He is less uncomfortable, he says is face isn't hurting " as bad as yesterday" and he says " I had a really good visit last night when my family and my girlfriend visited.  He completed his daily assessment first thing this morning and on it he wrote he denied SI and he rated his depression, hopelessness and anxiety " 5/0/6", respectively.   A He talked to this writer about his habit of banging his head. He said " I dont even know what happens sometimes.Marland Kitchen.Marland Kitchen.I'll just find myself doing it..." He is encouraged to get a patient jornal and begin to write down his feelings...throughout the course of the day. To learn to identify what , exactly his feelings are and what unhealthy behaviors he might have that are keeping him emotionally stuck.  He is receptive to this writer's feedback and instruction and says he wants to do " everything I can " to get better.   R Safety is in place and positive reinforcement is offered by this writer's for pt opening up, allowing this writer  to speak with him and for being willing to look at his behaviors objectively.

## 2015-07-06 DIAGNOSIS — F332 Major depressive disorder, recurrent severe without psychotic features: Secondary | ICD-10-CM | POA: Diagnosis present

## 2015-07-06 LAB — TSH: TSH: 2.21 u[IU]/mL (ref 0.350–4.500)

## 2015-07-06 NOTE — Progress Notes (Signed)
[facility]  07/06/2015  Rosalyn GessMichael E Krigbaum  MRN:  [id number]  Subjective:  Patient states: "I'm doing better overall, just still depressed. I just have a lot of regrets in life. I'm going to the groups, but I'm ashamed of myself."  Objective : Pt seen and chart reviewed. Pt is alert/oriented x4, calm, cooperative, yet very depressed. Pt presents with slow speech, hesitating as if in deep thought, although denies psychosis and does not appear to be responding to internal stimuli. Pt reports that he has been very hard on himself and is kicking himself for not reaching goals in life, including failing out of school. He does deny suicidal ideation/homicidal ideation. He cites good sleep and appetite.   Principal Problem: MDD (major depressive disorder), recurrent severe, without psychosis (HCC) Diagnosis:   Patient Active Problem List   Diagnosis Date Noted  . MDD (major depressive disorder), recurrent severe, without psychosis (HCC) [F33.2] 07/06/2015    Priority: High  . Alcohol use disorder, severe, dependence (HCC) [F10.20] 07/03/2015    Priority: High   Total Time spent with patient: 15 minutes   Past Medical History:  Past Medical History  Diagnosis Date  . Depression    History reviewed. No pertinent past surgical history. Family History: History reviewed. No pertinent family history.  Social History:  History  Alcohol Use  . Yes    Comment: Drinks 2-5 days a week     History  Drug Use  . Yes  . Special: Marijuana    Comment: Uses "off and on"    Social History   Social History  . Marital Status: Single    Spouse Name: N/A  . Number of Children: N/A  . Years of Education: N/A   Social History Main Topics  . Smoking status: Current Every Day Smoker -- 1.00 packs/day    Types: Cigarettes  . Smokeless tobacco: None  . Alcohol Use: Yes     Comment: Drinks 2-5 days a week  . Drug Use: Yes    Special: Marijuana     Comment: Uses "off and on"  . Sexual  Activity: Not Asked   Other Topics Concern  . None   Social History Narrative   Additional Social History:    Pain Medications: n/a Prescriptions: n/a Over the Counter: n/a History of alcohol / drug use?: Yes Longest period of sobriety (when/how long): 8 or 9 months in 2013 Negative Consequences of Use: Personal relationships, Financial Withdrawal Symptoms: Irritability, Aggressive/Assaultive ("If it's been a couple of days and I haven't had anything to drinkn") Name of Substance 1: Beer 1 - Age of First Use: 11  years 1 - Amount (size/oz): Equivalent of up to 6 beers per day 1 - Frequency: 2 to 5 days per week 1 - Duration: Several hours, mostly at night 1 - Last Use / Amount: 07/02/2015 Name of Substance 2: Cannabis 2 - Age of First Use: approx 24 years old 2 - Amount (size/oz): States only "casually" if a friend has some weed; that he never purchases it 2 - Frequency: Approximately once a week 2 - Duration: A few hours or hits? 2 - Last Use / Amount: "A few days ago".  Sleep:  Fair   Appetite:  improving  Current Medications: Current Facility-Administered Medications  Medication Dose Route Frequency Provider Last Rate Last Dose  . acetaminophen (TYLENOL) tablet 650 mg  650 mg Oral Q4H PRN Earney NavyJosephine C Onuoha, NP   650 mg at 07/05/15 2110  . alum &  mag hydroxide-simeth (MAALOX/MYLANTA) 200-200-20 MG/5ML suspension 30 mL  30 mL Oral PRN Earney Navy, NP      . FLUoxetine (PROZAC) capsule 20 mg  20 mg Oral Daily Craige Cotta, MD   20 mg at 07/06/15 0835  . ibuprofen (ADVIL,MOTRIN) tablet 600 mg  600 mg Oral Q6H PRN Adonis Brook, NP   600 mg at 07/06/15 1610  . LORazepam (ATIVAN) tablet 1 mg  1 mg Oral Q8H PRN Craige Cotta, MD   1 mg at 07/06/15 0837  . nicotine (NICODERM CQ - dosed in mg/24 hours) patch 21 mg  21 mg Transdermal Daily Earney Navy, NP   21 mg at 07/06/15 0836  . ondansetron (ZOFRAN) tablet 4 mg  4 mg Oral Q8H PRN Earney Navy, NP       . traZODone (DESYREL) tablet 50 mg  50 mg Oral QHS PRN Craige Cotta, MD   50 mg at 07/05/15 2110    Lab Results:  Results for orders placed or performed during the hospital encounter of 07/03/15 (from the past 48 hour(s))  TSH     Status: None   Collection Time: 07/06/15  6:55 AM  Result Value Ref Range   TSH 2.210 0.350 - 4.500 uIU/mL    Comment: Performed at [facility]    Physical Findings: AIMS: Facial and Oral Movements Muscles of Facial Expression: None, normal Lips and Perioral Area: None, normal Jaw: None, normal Tongue: None, normal,Extremity Movements Upper (arms, wrists, hands, fingers): None, normal Lower (legs, knees, ankles, toes): None, normal, Trunk Movements Neck, shoulders, hips: None, normal, Overall Severity Severity of abnormal movements (highest score from questions above): None, normal Incapacitation due to abnormal movements: None, normal Patient's awareness of abnormal movements (rate only patient's report): No Awareness, Dental Status Current problems with teeth and/or dentures?: No Does patient usually wear dentures?: No  CIWA:  CIWA-Ar Total: 1 COWS:  COWS Total Score: 1  Musculoskeletal: Strength & Muscle Tone: within normal limits- no tremors , no diaphoresis, no psychomotor agitation Gait & Station: normal Patient leans: N/A  Psychiatric Specialty Exam: Review of Systems  Psychiatric/Behavioral: Positive for depression and substance abuse. Negative for suicidal ideas and hallucinations. The patient is nervous/anxious.   All other systems reviewed and are negative.  no chest pain, no SOB, no nausea or vomiting   Blood pressure 117/71, pulse 81, temperature 98.2 F (36.8 C), temperature source Oral, resp. rate 18, height  (1.905 m), weight 95.255 kg (210 lb), SpO2 100 %.Body mass index is 26.25 kg/(m^2).  General Appearance: Fairly Groomed  Patent attorney::  Good  Speech:  Normal Rate  Volume:  Decreased  Mood:   Depressed- but states he is feeling better today. Does report some subjective anxiety  Affect:  Constricted  Thought Process:  Goal Directed and Linear  Orientation:  Full (Time, Place, and Person)  Thought Content:  denies hallucinations, no delusions, not internally preoccupied   Suicidal Thoughts:  No at this time denies suicidal ideations and contracts for safety on unit   Homicidal Thoughts:  No  Memory:  recent and remote grossly intact   Judgement:  Other:  improving   Insight:  improving   Psychomotor Activity:  Normal  Concentration:  Good  Recall:  Good  Fund of Knowledge:Good  Language: Good  Akathisia:  Negative  Handed:  Right  AIMS (if indicated):     Assets:  Architect Physical Health Resilience  ADL's:  Intact  Cognition: WNL  Sleep:  Number of Hours: 5.5   *On 07/06/15, I have reviewed and concur with treatment plan below, modified as follows:   Treatment Plan Summary: Daily contact with patient to assess and evaluate symptoms and progress in treatment, Medication management, Plan inpatient treatment  and medications as below  Encourage ongoing milieu, group participation to work on symptom reduction and coping skills  Continue to encourage and support abstinence, relapse prevention efforts Continue Prozac 20 mgrs QDAY for management of depression and anxiety Continue Trazodone at 50 mgrs QHS PRN for insomnia Continue Ativan 1 mgr Q 8 hour  PRNS for severe anxiety or potential alcohol WDL symptoms  Beau Fanny, FNP-BC 07/06/2015, 11:30 AM

## 2015-07-06 NOTE — Progress Notes (Signed)
[facility] Group Notes:  (Nursing/MHT/Case Management/Adjunct)  Date:  07/06/2015  Time:  12:11 AM  Type of Therapy:  Psychoeducational Skills  Participation Level:  Active  Participation Quality:  Appropriate  Affect:  Anxious  Cognitive:  Appropriate  Insight:  Appropriate  Engagement in Group:  Engaged  Modes of Intervention:  Education  Summary of Progress/Problems: Patient stated that he had a "fair" day yesterday. He stated that he felt anxious at times, but this increased at visiting time since their was a mix up with the number of visitors that were on the hallway with him. As for the theme of the day, his relapse prevention will be to talk more to people rather than allowing things to build up inside of him and to take up "jogging" once again.   Darrell [name] S 07/06/2015, 12:11 AM

## 2015-07-06 NOTE — Progress Notes (Signed)
Darrell [name] Darrell [name] is seen OOB UAL on the 400 hall today..he tolerates this well. He remains sad, depressed and flat. He is quiet, but is seen interacting with his peers in the dayroom. He states his facial discomfort is a " 4 out of 10" today.     A He completed his daily assessment and on it he wrote he denied SI and he rated his depression, hopelessness and anxiety  " 1/0/6", respectively. He says he is looking forward to visiting with his family tonight.    R Safety is in place .

## 2015-07-06 NOTE — [facility] Group Notes (Signed)
[facility] Group Notes:  (Clinical Social Work)   06/22/2015     1:15-2:15PM  Summary of Progress/Problems:   In today's process group a decisional balance exercise was used to explore in depth the perceived benefits and costs of alcohol and drugs, as well as the  benefits and costs of replacing these with healthy coping skills.  Patients listed healthy and unhealthy coping techniques, determining with CSW guidance that unhealthy coping techniques work initially, but eventually become harmful.  Motivational Interviewing and the whiteboard were utilized for the exercises.  The patient expressed that the unhealthy coping he often uses is shutting down and isolating.  He really was involved in the group discussion.  Type of Therapy:  Group Therapy - Process   Participation Level:  Active  Participation Quality:  Attentive and Sharing  Affect:  Blunted and Depressed  Cognitive:  Appropriate and Oriented  Insight:  Engaged  Engagement in Therapy:  Engaged  Modes of Intervention:  Education, Motivational Interviewing  Ambrose MantleMareida Grossman-Orr, LCSW 07/06/2015, 3:50 PM

## 2015-07-06 NOTE — Progress Notes (Signed)
D. Pt had been up and visible in milieu, attended evening group activity. Pt was seen interacting appropriately with fellow peers and received all medications without incident. A. Support provided. R. Safety maintained, will continue to monitor

## 2015-07-07 MED ORDER — OLANZAPINE 5 MG PO TBDP
5.0000 mg | ORAL_TABLET | Freq: Every day | ORAL | Status: DC | PRN
  Administered 2015-07-09: 5 mg via ORAL
  Filled 2015-07-07 (×2): qty 1

## 2015-07-07 NOTE — Progress Notes (Signed)
[facility] Group Notes:  (Nursing/MHT/Case Management/Adjunct)  Date:  07/07/2015  Time:  12:40 AM  Type of Therapy:  Psychoeducational Skills  Participation Level:  Active  Participation Quality:  Resistant  Affect:  Blunted, Depressed and Flat  Cognitive:  Appropriate  Insight:  Improving  Engagement in Group:  Developing/Improving  Modes of Intervention:  Education  Summary of Progress/Problems: The patient verbalized at great length that he was feeling very sad and dejected. He explained that he had attempted to talk to his family during their visit, but that the experience was unsuccessful. He explained that he was honest with his family, but that it appeared as if they did not understand him. The patient acknowledges that he normally keeps things bottled up inside and that he is doing everything that he can to verbalize his feelings and to communicate more. The patient received a lot of positive encouragement from his peers.   Penny Arrambide S 07/07/2015, 12:40 AM

## 2015-07-07 NOTE — Progress Notes (Signed)
[facility] MD Progress Note  07/07/2015  Darrell [name]  MRN:  [id number]  Subjective:  Patient states: "I'm doing well. A lot better since all of the people in the groups are supporting me."    Objective: Pt seen and chart reviewed. Pt is alert/oriented x4, calm, cooperative, and presents as less depressed today. Pt denies psychosis and does not appear to be responding to internal stimuli. He does deny suicidal ideation/homicidal ideation. He cites good sleep and appetite.   Principal Problem: MDD (major depressive disorder), recurrent severe, without psychosis (HCC) Diagnosis:   Patient Active Problem List   Diagnosis Date Noted  . MDD (major depressive disorder), recurrent severe, without psychosis (HCC) [F33.2] 07/06/2015    Priority: High  . Alcohol use disorder, severe, dependence (HCC) [F10.20] 07/03/2015    Priority: High   Total Time spent with patient: 15 minutes  Past Medical History:  Past Medical History  Diagnosis Date  . Depression    History reviewed. No pertinent past surgical history. Family History: History reviewed. No pertinent family history.  Social History:  History  Alcohol Use  . Yes    Comment: Drinks 2-5 days a week     History  Drug Use  . Yes  . Special: Marijuana    Comment: Uses "off and on"    Social History   Social History  . Marital Status: Single    Spouse Name: N/A  . Number of Children: N/A  . Years of Education: N/A   Social History Main Topics  . Smoking status: Current Every Day Smoker -- 1.00 packs/day    Types: Cigarettes  . Smokeless tobacco: None  . Alcohol Use: Yes     Comment: Drinks 2-5 days a week  . Drug Use: Yes    Special: Marijuana     Comment: Uses "off and on"  . Sexual Activity: Not Asked   Other Topics Concern  . None   Social History Narrative   Additional Social History:    Pain Medications: n/a Prescriptions: n/a Over the Counter: n/a History of alcohol / drug use?: Yes Longest period of  sobriety (when/how long): 8 or 9 months in 2013 Negative Consequences of Use: Personal relationships, Financial Withdrawal Symptoms: Irritability, Aggressive/Assaultive ("If it's been a couple of days and I haven't had anything to drinkn") Name of Substance 1: Beer 1 - Age of First Use: 11  years 1 - Amount (size/oz): Equivalent of up to 6 beers per day 1 - Frequency: 2 to 5 days per week 1 - Duration: Several hours, mostly at night 1 - Last Use / Amount: 07/02/2015 Name of Substance 2: Cannabis 2 - Age of First Use: approx 24 years old 2 - Amount (size/oz): States only "casually" if a friend has some weed; that he never purchases it 2 - Frequency: Approximately once a week 2 - Duration: A few hours or hits? 2 - Last Use / Amount: "A few days ago".  Sleep:  Fair   Appetite:  improving  Current Medications: Current Facility-Administered Medications  Medication Dose Route Frequency Provider Last Rate Last Dose  . acetaminophen (TYLENOL) tablet 650 mg  650 mg Oral Q4H PRN Earney Navy, NP   650 mg at 07/05/15 2110  . alum & mag hydroxide-simeth (MAALOX/MYLANTA) 200-200-20 MG/5ML suspension 30 mL  30 mL Oral PRN Earney Navy, NP      . FLUoxetine (PROZAC) capsule 20 mg  20 mg Oral Daily Craige Cotta, MD   20 mg  at 07/07/15 0810  . ibuprofen (ADVIL,MOTRIN) tablet 600 mg  600 mg Oral Q6H PRN Adonis Brook, NP   600 mg at 07/07/15 0810  . LORazepam (ATIVAN) tablet 1 mg  1 mg Oral Q8H PRN Craige Cotta, MD   1 mg at 07/07/15 1051  . nicotine (NICODERM CQ - dosed in mg/24 hours) patch 21 mg  21 mg Transdermal Daily Earney Navy, NP   21 mg at 07/07/15 0811  . OLANZapine zydis (ZYPREXA) disintegrating tablet 5 mg  5 mg Oral Daily PRN Beau Fanny, FNP      . ondansetron (ZOFRAN) tablet 4 mg  4 mg Oral Q8H PRN Earney Navy, NP      . traZODone (DESYREL) tablet 50 mg  50 mg Oral QHS PRN Craige Cotta, MD   50 mg at 07/06/15 2108    Lab Results:  Results for  orders placed or performed during the hospital encounter of 07/03/15 (from the past 48 hour(s))  TSH     Status: None   Collection Time: 07/06/15  6:55 AM  Result Value Ref Range   TSH 2.210 0.350 - 4.500 uIU/mL    Comment: Performed at [facility]    Physical Findings: AIMS: Facial and Oral Movements Muscles of Facial Expression: None, normal Lips and Perioral Area: None, normal Jaw: None, normal Tongue: None, normal,Extremity Movements Upper (arms, wrists, hands, fingers): None, normal Lower (legs, knees, ankles, toes): None, normal, Trunk Movements Neck, shoulders, hips: None, normal, Overall Severity Severity of abnormal movements (highest score from questions above): None, normal Incapacitation due to abnormal movements: None, normal Patient's awareness of abnormal movements (rate only patient's report): No Awareness, Dental Status Current problems with teeth and/or dentures?: No Does patient usually wear dentures?: No  CIWA:  CIWA-Ar Total: 1 COWS:  COWS Total Score: 1  Musculoskeletal: Strength & Muscle Tone: within normal limits- no tremors , no diaphoresis, no psychomotor agitation Gait & Station: normal Patient leans: N/A  Psychiatric Specialty Exam: Review of Systems  Psychiatric/Behavioral: Positive for depression and substance abuse. Negative for suicidal ideas and hallucinations. The patient is nervous/anxious.   All other systems reviewed and are negative.  no chest pain, no SOB, no nausea or vomiting   Blood pressure 124/82, pulse 75, temperature 97.7 F (36.5 C), temperature source Oral, resp. rate 18, height  (1.905 m), weight 95.255 kg (210 lb), SpO2 100 %.Body mass index is 26.25 kg/(m^2).  General Appearance: Fairly Groomed  Patent attorney::  Good  Speech:  Normal Rate  Volume:  Decreased  Mood:  Depressed- but states he is feeling better today. Does report some subjective anxiety  Affect:  Constricted  Thought Process:  Goal  Directed and Linear  Orientation:  Full (Time, Place, and Person)  Thought Content:  denies hallucinations, no delusions, not internally preoccupied   Suicidal Thoughts:  No at this time denies suicidal ideations and contracts for safety on unit   Homicidal Thoughts:  No  Memory:  recent and remote grossly intact   Judgement:  Other:  improving   Insight:  improving   Psychomotor Activity:  Normal  Concentration:  Good  Recall:  Good  Fund of Knowledge:Good  Language: Good  Akathisia:  Negative  Handed:  Right  AIMS (if indicated):     Assets:  Architect Physical Health Resilience  ADL's:  Intact  Cognition: WNL  Sleep:  Number of Hours: 6.25   *On 07/07/15, I have reviewed  and concur with treatment plan below, modified as follows:   Treatment Plan Summary: Daily contact with patient to assess and evaluate symptoms and progress in treatment, Medication management, Plan inpatient treatment  and medications as below  Encourage ongoing milieu, group participation to work on symptom reduction and coping skills  Continue to encourage and support abstinence, relapse prevention efforts Continue Prozac 20 mgrs QDAY for management of depression and anxiety Continue Trazodone at 50 mgrs QHS PRN for insomnia Continue Ativan 1 mgr Q 8 hour  PRNS for severe anxiety or potential alcohol WDL symptoms  Beau FannyWithrow, Dangela How C, FNP-BC 07/07/2015, 10:30 AM

## 2015-07-07 NOTE — Progress Notes (Signed)
Writer has observed patient up in the dayroom watching tv and playing cards with peers. He attended group this evening and participated. He  requested prns for anxiety, pain and sleep which he received. He denies si/hi/a/v hallucinations. Support and encouragement given, safety maintained on unit with 15 min checks.

## 2015-07-07 NOTE — [facility] Group Notes (Signed)
[facility] Group Notes:  (Nursing/MHT/Case Management/Adjunct)  Date:  07/07/2015  Time:  12:48 PM  Type of Therapy:  Psychoeducational Skills  Participation Level:  Active  Participation Quality:  Appropriate  Affect:  Appropriate  Cognitive:  Appropriate  Insight:  Appropriate  Engagement in Group:  Engaged  Modes of Intervention:  Discussion  Summary of Progress/Problems: Pt did attend self inventory group, pt reported that he was negative SI/HI, no AH/VH noted. Pt rated his depression as a 7, and his helplessness/hopelessness as a 2.     Pt reported no issues or concerns.   Jacquelyne BalintForrest, Yoselyn Mcglade Shanta 07/07/2015, 12:48 PM

## 2015-07-07 NOTE — Progress Notes (Signed)
Patient ID: Darrell BachMichael E Dobos, male   DOB: 1991/05/03, [age] y.o.   MRN: [id number]  DAR: Pt. Denies SI/HI and A/V Hallucinations. Patient reports pain is 4/10 in his face from PTA incident. PRN medication given to patient for pain. Support and encouragement provided to the patient. Scheduled medications administered to patient per physician's orders. Patient is receptive and cooperative. Patient reports his agitation around a 5/10. Patient came to writer near dinner time and stated that he would like something to help decrease agitation however patient was not expressing outward signs of severe anger and ativan was unavailable at that time. Writer encouraged to stay back from dinner if it would be too much stimulation. Q15 minute checks are maintained for safety.

## 2015-07-07 NOTE — [facility] Group Notes (Signed)
[facility] Group Notes: (Clinical Social Work)   07/07/2015      Type of Therapy:  Group Therapy   Participation Level:  Did Not Attend despite MHT prompting   Draycen Leichter Grossman-Orr, LCSW 07/07/2015, 4:20 PM     

## 2015-07-07 NOTE — Progress Notes (Signed)
Writer has observed patient up in the dayroom watching tv with minimal interaction with peers. Writer spoke with him 1:1 and he c/o feeling anxious and received a prn of ativan. Patient reports that he plans to work on his temper and talk things out more, Support given, safety maintained with 15 min checks.

## 2015-07-08 NOTE — Plan of Care (Signed)
Problem: Diagnosis: Increased Risk For Suicide Attempt Goal: STG-Patient Will Attend All Groups On The Unit Outcome: Progressing Patient attended wrap up group tonight and particpated.

## 2015-07-08 NOTE — [facility] Group Notes (Signed)
[facility]  07/08/2015 8:45 AM  Participation Quality: Alert, Appropriate and Oriented  Mood/Affect: Flat  Depression Rating: 7  Anxiety Rating: 7  Thoughts of Suicide: Pt denies SI/HI  Will you contract for safety? Yes  Current AVH: Pt denies  Plan for Discharge/Comments: Pt attended discharge planning group and actively participated in group. CSW discussed suicide prevention education with the group and encouraged them to discuss discharge planning and any relevant barriers. Pt reports that he was informed that a family session may be beneficial with his mother and describes being anxious about this due to communication problems with his mother. Pt is requesting information on his medications and diagnoses.  Transportation Means: Pt reports access to transportation  Supports: No supports mentioned at this time  Darrell CordialLauren [name], LCSWA 07/08/2015 10:11 AM

## 2015-07-08 NOTE — Progress Notes (Signed)
[facility] Group Notes:  (Nursing/MHT/Case Management/Adjunct)  Date:  07/08/2015  Time:  1:10 AM  Type of Therapy:  Psychoeducational Skills  Participation Level:  Minimal  Participation Quality:  Attentive  Affect:  Flat  Cognitive:  Appropriate  Insight:  Good  Engagement in Group:  Limited  Modes of Intervention:  Education  Summary of Progress/Problems: The patient shared in group that his day was "okay" overall and that he felt restless at times. The patient also shared that his family visit went well due in part since his mother did not come in to see him. As for the theme of the day, his support system will consist of his mother, sister, and girlfriend.   Monita Swier S 07/08/2015, 1:10 AM

## 2015-07-08 NOTE — Progress Notes (Signed)
Adult Psychoeducational Group Note  Date:  07/08/2015 Time:  9:28 PM  Group Topic/Focus:  Wrap-Up Group:   The focus of this group is to help patients review their daily goal of treatment and discuss progress on daily workbooks.  Participation Level:  Active  Participation Quality:  Appropriate and Attentive  Affect:  Appropriate  Cognitive:  Appropriate  Insight: Appropriate  Engagement in Group:  Engaged  Modes of Intervention:  Discussion  Additional Comments:  Pt rated his day a 6/10. Pt stated he wanted to talk to the doctor and get information about his medications, which he accomplished. Pt stated his anxiety is still high, which he hadn't realized was such a problem. His goal for today was to do 100 push ups, which he had 20 left to complete. Pt stated one positive thing is that there was lots of laughter today.  Darrell [name], Darrell [name] 07/08/2015, 9:28 PM

## 2015-07-08 NOTE — [facility] Suicide Risk Assessment (Signed)
[facility] INPATIENT:  Family/Significant Other Suicide Prevention Education  Suicide Prevention Education:  Contact Attempts: Darrell BlancoFaith [name], Pt's girlfriend [phone], (name of family member/significant other) has been identified by the patient as the family member/significant other with whom the patient will be residing, and identified as the person(s) who will aid the patient in the event of a mental health crisis.  With written consent from the patient, two attempts were made to provide suicide prevention education, prior to and/or following the patient's discharge.  We were unsuccessful in providing suicide prevention education.  A suicide education pamphlet was given to the patient to share with family/significant other.  Date and time of first attempt: 07/05/15 @ 12:00pm Date and time of second attempt: 07/08/15 @ 11:30pm  Darrell [name], Darrell [name] 07/08/2015, 11:35 AM

## 2015-07-08 NOTE — [facility] Group Notes (Signed)
[facility] LCSW Group Therapy  07/08/2015 1:15pm  Type of Therapy:  Group Therapy vercoming Obstacles  Participation Level:  Active  Participation Quality:  Appropriate   Affect:  Appropriate  Cognitive:  Appropriate and Oriented  Insight:  Developing/Improving and Improving  Engagement in Therapy:  Improving  Modes of Intervention:  Discussion, Exploration, Problem-solving and Support  Description of Group:   In this group patients will be encouraged to explore what they see as obstacles to their own wellness and recovery. They will be guided to discuss their thoughts, feelings, and behaviors related to these obstacles. The group will process together ways to cope with barriers, with attention given to specific choices patients can make. Each patient will be challenged to identify changes they are motivated to make in order to overcome their obstacles. This group will be process-oriented, with patients participating in exploration of their own experiences as well as giving and receiving support and challenge from other group members.  Summary of Patient Progress: Pt continues to participate actively in group. He expressed that his difficulty controlling his anger and expressing his emotions in a healthy way make his relationships more conflictual. He also expressed that his inability and difficulty expressing his emotions causes his to feel depressed. He reports that he often questions his value because of his difficulty finding employment. Pt interacts well with peers and identifies perseverance, motivation, and support as necessary attributes to have when overcoming the obstacles.    Therapeutic Modalities:   Cognitive Behavioral Therapy Solution Focused Therapy Motivational Interviewing Relapse Prevention Therapy   Chad CordialLauren Carter, LCSWA 07/08/2015 4:09 PM

## 2015-07-08 NOTE — Progress Notes (Signed)
Patient ID: Darrell [name], male   DOB: 24JW COVIN3 y.o.   MRN: [id number] [facility] MD Progress Note  07/08/2015  Darrell [name]  MRN:  [id number]  Subjective:  Patient  Reports partial improvement , but still does not feel his mood is at baseline. Reports some ongoing depression and anxiety. Denies medication side effects at present . Reports some frustration with mother, because feels " she really does not listen to me ".     Objective: Pt case discussed with treatment team and patient seen.  Patient reports  Partial improvement compared to admission, but endorses some ongoing symptoms, particularly anxiety, and to a lesser degree, some residual depression. At this time denies medication side effects. No disruptive or overtly agitated behaviors on unit. Visible on day room, interacting with peers, noted to be playing games, cards with selected peers . Has been going to groups, participative . Facial lacerations improved, facial pain improved      Principal Problem: MDD (major depressive disorder), recurrent severe, without psychosis (HCC) Diagnosis:   Patient Active Problem List   Diagnosis Date Noted  . MDD (major depressive disorder), recurrent severe, without psychosis (HCC) [F33.2] 07/06/2015  . Alcohol use disorder, severe, dependence (HCC) [F10.20] 07/03/2015   Total Time spent with patient:  20  minutes  Past Medical History:  Past Medical History  Diagnosis Date  . Depression    History reviewed. No pertinent past surgical history. Family History: History reviewed. No pertinent family history.  Social History:  History  Alcohol Use  . Yes    Comment: Drinks 2-5 days a week     History  Drug Use  . Yes  . Special: Marijuana    Comment: Uses "off and on"    Social History   Social History  . Marital Status: Single    Spouse Name: N/A  . Number of Children: N/A  . Years of Education: N/A   Social History Main Topics  . Smoking status: Current Every Day  Smoker -- 1.00 packs/day    Types: Cigarettes  . Smokeless tobacco: None  . Alcohol Use: Yes     Comment: Drinks 2-5 days a week  . Drug Use: Yes    Special: Marijuana     Comment: Uses "off and on"  . Sexual Activity: Not Asked   Other Topics Concern  . None   Social History Narrative   Additional Social History:    Pain Medications: n/a Prescriptions: n/a Over the Counter: n/a History of alcohol / drug use?: Yes Longest period of sobriety (when/how long): 8 or 9 months in 2013 Negative Consequences of Use: Personal relationships, Financial Withdrawal Symptoms: Irritability, Aggressive/Assaultive ("If it's been a couple of days and I haven't had anything to drinkn") Name of Substance 1: Beer 1 - Age of First Use: 11  years 1 - Amount (size/oz): Equivalent of up to 6 beers per day 1 - Frequency: 2 to 5 days per week 1 - Duration: Several hours, mostly at night 1 - Last Use / Amount: 07/02/2015 Name of Substance 2: Cannabis 2 - Age of First Use: approx 24 years old 2 - Amount (size/oz): States only "casually" if a friend has some weed; that he never purchases it 2 - Frequency: Approximately once a week 2 - Duration: A few hours or hits? 2 - Last Use / Amount: "A few days ago".  Sleep:   Improved    Appetite:  improving  Current Medications: Current Facility-Administered Medications  Medication Dose  Route Frequency Provider Last Rate Last Dose  . acetaminophen (TYLENOL) tablet 650 mg  650 mg Oral Q4H PRN Earney NavyJosephine C Onuoha, NP   650 mg at 07/07/15 2102  . alum & mag hydroxide-simeth (MAALOX/MYLANTA) 200-200-20 MG/5ML suspension 30 mL  30 mL Oral PRN Earney NavyJosephine C Onuoha, NP      . FLUoxetine (PROZAC) capsule 20 mg  20 mg Oral Daily Craige CottaFernando A Hamlin Devine, MD   20 mg at 07/08/15 0805  . ibuprofen (ADVIL,MOTRIN) tablet 600 mg  600 mg Oral Q6H PRN Adonis BrookSheila Agustin, NP   600 mg at 07/07/15 1631  . LORazepam (ATIVAN) tablet 1 mg  1 mg Oral Q8H PRN Craige CottaFernando A Berkley Wrightsman, MD   1 mg at  07/08/15 1059  . nicotine (NICODERM CQ - dosed in mg/24 hours) patch 21 mg  21 mg Transdermal Daily Earney NavyJosephine C Onuoha, NP   21 mg at 07/08/15 0800  . OLANZapine zydis (ZYPREXA) disintegrating tablet 5 mg  5 mg Oral Daily PRN Beau FannyJohn C Withrow, FNP      . ondansetron (ZOFRAN) tablet 4 mg  4 mg Oral Q8H PRN Earney NavyJosephine C Onuoha, NP      . traZODone (DESYREL) tablet 50 mg  50 mg Oral QHS PRN Craige CottaFernando A Kathy Wares, MD   50 mg at 07/07/15 2102    Lab Results:  No results found for this or any previous visit (from the past 48 hour(s)).  Physical Findings: AIMS: Facial and Oral Movements Muscles of Facial Expression: None, normal Lips and Perioral Area: None, normal Jaw: None, normal Tongue: None, normal,Extremity Movements Upper (arms, wrists, hands, fingers): None, normal Lower (legs, knees, ankles, toes): None, normal, Trunk Movements Neck, shoulders, hips: None, normal, Overall Severity Severity of abnormal movements (highest score from questions above): None, normal Incapacitation due to abnormal movements: None, normal Patient's awareness of abnormal movements (rate only patient's report): No Awareness, Dental Status Current problems with teeth and/or dentures?: No Does patient usually wear dentures?: No  CIWA:  CIWA-Ar Total: 1 COWS:  COWS Total Score: 1  Musculoskeletal: Strength & Muscle Tone: within normal limits- no tremors , no diaphoresis, no psychomotor agitation Gait & Station: normal Patient leans: N/A  Psychiatric Specialty Exam: Review of Systems  Psychiatric/Behavioral: Positive for depression and substance abuse. Negative for suicidal ideas and hallucinations. The patient is nervous/anxious.   All other systems reviewed and are negative.  no chest pain, no SOB, no nausea or vomiting   Blood pressure 109/71, pulse 125, temperature 98.7 F (37.1 C), temperature source Oral, resp. rate 16, height 6\' 3"  (1.905 m), weight 210 lb (95.255 kg), SpO2 100 %.Body mass index is 26.25  kg/(m^2).  General Appearance: Fairly Groomed  Patent attorneyye Contact::  Good  Speech:  Normal Rate  Volume:  Normal  Mood:  Partially improved but still depressed   Affect:  Still somewhat constricted, but does smile at times appropriately  Thought Process:  Goal Directed and Linear  Orientation:  Full (Time, Place, and Person)  Thought Content:  denies hallucinations, no delusions, not internally preoccupied   Suicidal Thoughts:  No at this time denies suicidal ideations and contracts for safety on unit   Homicidal Thoughts:  No  Memory:  recent and remote grossly intact   Judgement:  Other:  improving   Insight:  improving   Psychomotor Activity:  Normal- no restlessness or agitation   Concentration:  Good  Recall:  Good  Fund of Knowledge:Good  Language: Good  Akathisia:  Negative  Handed:  Right  AIMS (if indicated):     Assets:  Architect Physical Health Resilience  ADL's:  Intact  Cognition: WNL  Sleep:  Number of Hours: 6    Assessment -  Reports partial improvement compared to admission but does not feel back to baseline and describes some ongoing anxiety, depression. Today also describes some family tension, frustration with mother. Denies medication side effects, feels they are helping .  Treatment Plan Summary: Daily contact with patient to assess and evaluate symptoms and progress in treatment, Medication management, Plan inpatient treatment  and medications as below  Encourage ongoing milieu, group participation to work on symptom reduction and coping skills  Continue to encourage and support abstinence, relapse prevention efforts Continue Prozac 20 mgrs QDAY for management of depression and anxiety Continue Trazodone at 50 mgrs QHS PRN for insomnia Continue Ativan 1 mgr Q 8 hour  PRNS for severe anxiety or potential alcohol WDL symptoms Patient expressing interest in a family meeting , will review with CSW to arrange   Nehemiah Massed,  MD  07/08/2015, 10:30 AM

## 2015-07-08 NOTE — Progress Notes (Signed)
D: Pt presents anxious on approach. Pt reports feeling anxious and depressed this morning. Pt rates depression 6/10. Anxiety 7/10. Pt reports that he's having ongoing conflict with his mother because she doesn't listen to him and she listens to respond, not to understand. Pt denies suicidal thoughts. Pt verbally contracts not to harm self. Pt requested prn Ativan for anxiety. Ativan given to pt at his request for anxiety. Pt reports fair sleep and appetite. Pt reports poor concentration. Pt compliant with attending groups and taking meds. No adverse reactions verbalized by pt. A: Medications administered as ordered per MD. Verbal support given. Pt encouraged to attend groups. 15 minute checks performed for safety.  R: Pt receptive to tx. Pt reports that he's using his coping skills to manage his anger.

## 2015-07-08 NOTE — Progress Notes (Signed)
D   Pt endorses anxiety and depression   He has appropriate interaction with others and is compliant with treatment   He attends groups and participates well  A   Verbal support given   Medications administered and effectiveness monitored   Q 15 min checks    R   Pt safe at present and receptive to verbal support 

## 2015-07-09 MED ORDER — MIRTAZAPINE 15 MG PO TABS
7.5000 mg | ORAL_TABLET | Freq: Every day | ORAL | Status: DC
  Administered 2015-07-09: 7.5 mg via ORAL
  Filled 2015-07-09: qty 0.5
  Filled 2015-07-09: qty 1
  Filled 2015-07-09 (×2): qty 0.5

## 2015-07-09 NOTE — [facility] Group Notes (Signed)
[facility] LCSW Group Therapy 07/09/2015 1:15 PM  Type of Therapy: Group Therapy- Feelings about Diagnosis  Participation Level: Active   Participation Quality:  Appropriate  Affect:  Appropriate  Cognitive: Alert and Oriented   Insight:  Developing   Engagement in Therapy: Developing/Improving and Engaged   Modes of Intervention: Clarification, Confrontation, Discussion, Education, Exploration, Limit-setting, Orientation, Problem-solving, Rapport Building, Dance movement psychotherapisteality Testing, Socialization and Support  Description of Group:   This group will allow patients to explore their thoughts and feelings about diagnoses they have received. Patients will be guided to explore their level of understanding and acceptance of these diagnoses. Facilitator will encourage patients to process their thoughts and feelings about the reactions of others to their diagnosis, and will guide patients in identifying ways to discuss their diagnosis with significant others in their lives. This group will be process-oriented, with patients participating in exploration of their own experiences as well as giving and receiving support and challenge from other group members.  Summary of Progress/Problems:  Pt was more reserved in group today but continues to participate willingly. Pt reported that he often feels that he is alone in his struggle as his family is not sure how to help him. Pt describes recognizing that he has anxiety and difficulty expressing his emotions.   Therapeutic Modalities:   Cognitive Behavioral Therapy Solution Focused Therapy Motivational Interviewing Relapse Prevention Therapy  Chad CordialLauren Carter, LCSWA 07/09/2015 3:44 PM

## 2015-07-09 NOTE — Progress Notes (Signed)
D:  Patient's self inventory sheet, patient sleeps good, sleep medication is helpful.  Fair appetite, low energy level, poor concentration.  Rated depression 4, hopeless 1, anxiety 7.  Withdrawals, chilling, cravings, agitation, irritability.  Denied SI.  Physical problems, lightheaded, headaches.  Physical pain, worst pain #2 in past 24 hours, hands, shin.  Pain medication is helpful sometimes.  Goal is to find out about discharge and therapist/counselor information.  Plans to discuss with MD/SW.  Some staff are nice.  No discharge plans. A:  Medications administered per MD orders.  Emotional support and encouragement given patient. R:  Denied SI and HI, contracts for safety.  Denied A/V hallucinations.  Safety maintained with 15 minute checks.

## 2015-07-09 NOTE — Progress Notes (Signed)
Recreation Therapy Notes  Animal-Assisted Activity (AAA) Program Checklist/Progress Notes Patient Eligibility Criteria Checklist & Daily Group note for Rec Tx Intervention  Date: 11.15.2016 Time: 2:45pm [address] [address]    AAA/T Program Assumption of Risk Form signed by Patient/ or Parent Legal Guardian yes  Patient is free of allergies or sever asthma yes  Patient reports no fear of animals yes  Patient reports no history of cruelty to animals yes  Patient understands his/her participation is voluntary yes  Patient washes hands before animal contact yes  Patient washes hands after animal contact yes  Behavioral Response: Appropriate   Education: Hand Washing, Appropriate Animal Interaction   Education Outcome: Acknowledges education.   Clinical Observations/Feedback: Patient appropriately interacted with therapy dog and peers during session.   Marykay Lexenise L Shamond Skelton, LRT/CTRS        Keonda Dow L 07/09/2015 4:01 PM

## 2015-07-09 NOTE — [facility] Group Notes (Signed)
The focus of this group is to educate the patient on the purpose and policies of crisis stabilization and provide a format to answer questions about their admission.  The group details unit policies and expectations of patients while admitted.  Patient attended 0900 nurse education orientation group this morning.  Patient actively participated and had appropriate affect.  Patient was alert.  Patient had appropriate insight and appropriate engagement.  Today patient will work on 3 goals for discharge.   Patient stated his best friends are his girlfriend and his mother.  Plans to exercise, running, playing ball, physical activity help him deal with his anger/anxiety.  Also plans to return to college.

## 2015-07-09 NOTE — Progress Notes (Signed)
Patient ID: Darrell [name], male   DOB: [birth year], 24 y.o.   [id number] [facility]  07/09/2015  Rosalyn GessMichael E Evrard  MRN:  [id number]  Subjective:   Patient states that he is feeling partially better, although states he continues to have significant anxiety.  Denies medication side effects. States he had a good visit from GF yesterday. States visit went well.     Objective: Pt case discussed with treatment team and patient seen.  Anxiety is reported as ongoing, still significant, with episodes of increased anxiety , " palpitations", increased worrying.  Depression is described as better. States he had good news that his best friend's mother is travelling from out of state to visit him. Denies medication side effects. Has been going to groups, participative , interactive with peers, mostly other patients of about his age .  Still presents with a somewhat depressed demeanor, but states he feels better .  He is future oriented, and states he is planning on trying to get into college as of next semester.   Principal Problem: MDD (major depressive disorder), recurrent severe, without psychosis (HCC) Diagnosis:   Patient Active Problem List   Diagnosis Date Noted  . MDD (major depressive disorder), recurrent severe, without psychosis (HCC) [F33.2] 07/06/2015  . Alcohol use disorder, severe, dependence (HCC) [F10.20] 07/03/2015   Total Time spent with patient:  20  minutes  Past Medical History:  Past Medical History  Diagnosis Date  . Depression    History reviewed. No pertinent past surgical history. Family History: History reviewed. No pertinent family history.  Social History:  History  Alcohol Use  . Yes    Comment: Drinks 2-5 days a week     History  Drug Use  . Yes  . Special: Marijuana    Comment: Uses "off and on"    Social History   Social History  . Marital Status: Single    Spouse Name: N/A  . Number of Children: N/A  . Years of Education: N/A    Social History Main Topics  . Smoking status: Current Every Day Smoker -- 1.00 packs/day    Types: Cigarettes  . Smokeless tobacco: None  . Alcohol Use: Yes     Comment: Drinks 2-5 days a week  . Drug Use: Yes    Special: Marijuana     Comment: Uses "off and on"  . Sexual Activity: Not Asked   Other Topics Concern  . None   Social History Narrative   Additional Social History:    Pain Medications: n/a Prescriptions: n/a Over the Counter: n/a History of alcohol / drug use?: Yes Longest period of sobriety (when/how long): 8 or 9 months in 2013 Negative Consequences of Use: Personal relationships, Financial Withdrawal Symptoms: Irritability, Aggressive/Assaultive ("If it's been a couple of days and I haven't had anything to drinkn") Name of Substance 1: Beer 1 - Age of First Use: 11  years 1 - Amount (size/oz): Equivalent of up to 6 beers per day 1 - Frequency: 2 to 5 days per week 1 - Duration: Several hours, mostly at night 1 - Last Use / Amount: 07/02/2015 Name of Substance 2: Cannabis 2 - Age of First Use: 24 years old 2 - Amount (size/oz): States only "casually" if a friend has some weed; that he never purchases it 2 - Frequency: Approximately once a week 2 - Duration: A few hours or hits? 2 - Last Use / Amount: "A few days ago".  Sleep:  Improved    Appetite:  improving  Current Medications: Current Facility-Administered Medications  Medication Dose Route Frequency Provider Last Rate Last Dose  . acetaminophen (TYLENOL) tablet 650 mg  650 mg Oral Q4H PRN Earney Navy, NP   650 mg at 07/07/15 2102  . alum & mag hydroxide-simeth (MAALOX/MYLANTA) 200-200-20 MG/5ML suspension 30 mL  30 mL Oral PRN Earney Navy, NP      . FLUoxetine (PROZAC) capsule 20 mg  20 mg Oral Daily Craige Cotta, MD   20 mg at 07/09/15 0724  . ibuprofen (ADVIL,MOTRIN) tablet 600 mg  600 mg Oral Q6H PRN Adonis Brook, NP   600 mg at 07/09/15 0830  . LORazepam (ATIVAN)  tablet 1 mg  1 mg Oral Q8H PRN Craige Cotta, MD   1 mg at 07/09/15 0830  . nicotine (NICODERM CQ - dosed in mg/24 hours) patch 21 mg  21 mg Transdermal Daily Earney Navy, NP   21 mg at 07/09/15 0724  . OLANZapine zydis (ZYPREXA) disintegrating tablet 5 mg  5 mg Oral Daily PRN Beau Fanny, FNP      . ondansetron (ZOFRAN) tablet 4 mg  4 mg Oral Q8H PRN Earney Navy, NP      . traZODone (DESYREL) tablet 50 mg  50 mg Oral QHS PRN Craige Cotta, MD   50 mg at 07/08/15 2207    Lab Results:  No results found for this or any previous visit (from the past 48 hour(s)).  Physical Findings: AIMS: Facial and Oral Movements Muscles of Facial Expression: None, normal Lips and Perioral Area: None, normal Jaw: None, normal Tongue: None, normal,Extremity Movements Upper (arms, wrists, hands, fingers): None, normal Lower (legs, knees, ankles, toes): None, normal, Trunk Movements Neck, shoulders, hips: None, normal, Overall Severity Severity of abnormal movements (highest score from questions above): None, normal Incapacitation due to abnormal movements: None, normal Patient's awareness of abnormal movements (rate only patient's report): No Awareness, Dental Status Current problems with teeth and/or dentures?: No Does patient usually wear dentures?: No  CIWA:  CIWA-Ar Total: 1 COWS:  COWS Total Score: 1  Musculoskeletal: Strength & Muscle Tone: within normal limits- no tremors , no diaphoresis, no psychomotor agitation Gait & Station: normal Patient leans: N/A  Psychiatric Specialty Exam: Review of Systems  Psychiatric/Behavioral: Positive for depression and substance abuse. Negative for suicidal ideas and hallucinations. The patient is nervous/anxious.   All other systems reviewed and are negative.  no chest pain, no SOB, no nausea or vomiting , describes lose stools .  Blood pressure 124/81, pulse 82, temperature 97.8 F (36.6 C), temperature source Oral, resp. rate 18,  height  (1.905 m), weight 210 lb (95.255 kg), SpO2 100 %.Body mass index is 26.25 kg/(m^2).  General Appearance: Fairly Groomed  Patent attorney::  Good  Speech:  Normal Rate  Volume:  Normal  Mood:   Improved compared to admission   Affect:   Slowly improving , but still constricted, anxious   Thought Process:  Goal Directed and Linear  Orientation:  Full (Time, Place, and Person)  Thought Content:  denies hallucinations, no delusions, not internally preoccupied   Suicidal Thoughts:  No at this time denies suicidal ideations and contracts for safety on unit   Homicidal Thoughts:  No  Memory:  recent and remote grossly intact   Judgement:  Other:  improving   Insight:  improving   Psychomotor Activity:  Normal- no restlessness or agitation   Concentration:  Good  Recall:  Good  Fund of Knowledge:Good  Language: Good  Akathisia:  Negative  Handed:  Right  AIMS (if indicated):     Assets:  Architect Physical Health Resilience  ADL's:  Intact  Cognition: WNL  Sleep:  Number of Hours: 6.75    Assessment -   Patient reports ongoing anxiety, and to a lesser degree, some ongoing depression. He is not suicidal and denies any psychotic symptoms. He is becoming more future oriented. He is tolerating medications well. He states, however, that he feels far from baseline and does not feel he is close to his  " normal self"as of yet. He states he does not feel ready for discharge . We discussed options and agrees to add Remeron to antidepressant regimen in an effort to improve ongoing anxiety, depressive symptoms  Treatment Plan Summary: Daily contact with patient to assess and evaluate symptoms and progress in treatment, Medication management, Plan inpatient treatment  and medications as below  Encourage ongoing milieu, group participation to work on symptom reduction and coping skills  Continue to encourage and support abstinence, relapse  prevention efforts Continue Prozac 20 mgrs QDAY for management of depression and anxiety D/C Trazodone Start Remeron 7.5 mgrs QHS for Insomnia, depression.  Continue Ativan 1 mgr Q 8 hour  PRNS for severe anxiety or potential alcohol WDL symptoms Patient expressing interest in a family meeting , will review with CSW to arrange   Nehemiah Massed,  MD  07/09/2015, 10:30 AM

## 2015-07-09 NOTE — Tx Team (Signed)
Interdisciplinary Treatment Plan Update (Adult) Date: 07/09/2015   Date: 07/09/2015 9:41 AM  Progress in Treatment:  Attending groups: Yes  Participating in groups: Yes  Taking medication as prescribed: Yes  Tolerating medication: Yes  Family/Significant othe contact made: No, contact attempts unsuccessful Patient understands diagnosis: Yes Discussing patient identified problems/goals with staff: Yes  Medical problems stabilized or resolved: Yes  Denies suicidal/homicidal ideation: Yes Patient has not harmed self or Others: Yes   New problem(s) identified: None identified at this time.   Discharge Plan or Barriers: Pt will return home and follow-up with [facility]  Additional comments: n/a   Reason for Continuation of Hospitalization:  Depression Medication stabilization Suicidal ideation   Estimated length of stay: 2-3 days  Review of initial/current patient goals per problem list:   1.  Goal(s): Patient will participate in aftercare plan  Met:  Yes  Target date: 3-5 days from date of admission   As evidenced by: Patient will participate within aftercare plan AEB aftercare provider and housing plan at discharge being identified.   07/04/15: Pt will discharge home and follow-up with [facility]  2.  Goal (s): Patient will exhibit decreased depressive symptoms and suicidal ideations.  Met:  No  Target date: 3-5 days from date of admission   As evidenced by: Patient will utilize self rating of depression at 3 or below and demonstrate decreased signs of depression or be deemed stable for discharge by MD. 07/04/15: Pt was admitted with symptoms of depression, rating 10/10. Pt continues to present with flat affect and depressive symptoms.  Pt will demonstrate decreased symptoms of depression and rate depression at 3/10 or lower prior to discharge. 07/09/15: Pt rates depression at 7/10.  Attendees:  Patient:     Family:    Physician: Dr. Parke Poisson, MD  07/09/2015 9:41 AM  Nursing: Lars Pinks, RN Case manager  07/09/2015 9:41 AM  Clinical Social Worker Norman Clay, MSW 07/09/2015 9:41 AM  Other: Lucinda Dell, Beverly Sessions Liasion 07/09/2015 9:41 AM  Clinical: Grayland Ormond RN 07/09/2015 9:41 AM  Other: , RN Charge Nurse 07/09/2015 9:41 AM  Other:     Peri Maris, Latanya Presser MSW

## 2015-07-10 MED ORDER — LORAZEPAM 1 MG PO TABS
1.0000 mg | ORAL_TABLET | Freq: Four times a day (QID) | ORAL | Status: DC | PRN
  Administered 2015-07-10 – 2015-07-13 (×6): 1 mg via ORAL
  Filled 2015-07-10 (×5): qty 1

## 2015-07-10 MED ORDER — LORAZEPAM 1 MG PO TABS
ORAL_TABLET | ORAL | Status: AC
  Filled 2015-07-10: qty 1

## 2015-07-10 MED ORDER — ARIPIPRAZOLE 2 MG PO TABS
2.0000 mg | ORAL_TABLET | Freq: Every day | ORAL | Status: DC
  Administered 2015-07-11 – 2015-07-13 (×3): 2 mg via ORAL
  Filled 2015-07-10 (×2): qty 1
  Filled 2015-07-10 (×2): qty 7
  Filled 2015-07-10 (×2): qty 1

## 2015-07-10 MED ORDER — TRAZODONE HCL 50 MG PO TABS
50.0000 mg | ORAL_TABLET | Freq: Every day | ORAL | Status: DC
  Administered 2015-07-10: 50 mg via ORAL
  Filled 2015-07-10 (×4): qty 1

## 2015-07-10 NOTE — Progress Notes (Signed)
D   Pt endorses anxiety and depression   He has appropriate interaction with others and is compliant with treatment   He attends groups and participates well  A   Verbal support given   Medications administered and effectiveness monitored   Q 15 min checks    R   Pt safe at present and receptive to verbal support

## 2015-07-10 NOTE — [facility] Group Notes (Signed)
Adult Psychoeducational Group Note  Date:  07/10/2015 Time:  12:00 AM  Group Topic/Focus:  Wrap-Up Group:   The focus of this group is to help patients review their daily goal of treatment and discuss progress on daily workbooks.  Participation Level:  Active  Participation Quality:  Appropriate  Affect:  Appropriate  Cognitive:  Appropriate  Insight: Appropriate  Engagement in Group:  Engaged  Modes of Intervention:  Discussion  Additional Comments:  Patient stated he had a fairly decent day.  He expressed he woke up indifferent.  He said he got some good news that his friends mom is going to be part of his support group.  He also got a visit from his sister and mother.  He also stated his anxiety was real high today.  Caroll RancherLindsay, Jiyan Walkowski A 07/10/2015, 12:00 AM

## 2015-07-10 NOTE — Progress Notes (Signed)
Per Dr. Jama Flavorsobos verbal order: d/c Zyprexa, order trazodone 50 mg at bedtime and modify Ativan 1 mg to every 6 hours.

## 2015-07-10 NOTE — [facility] Group Notes (Signed)
[facility] LCSW Group Therapy 07/10/2015 1:15 PM  Type of Therapy: Group Therapy- Emotion Regulation  Participation Level: Minimal  Participation Quality:  Appropriate  Affect: Flat  Cognitive: Alert and Oriented   Insight:  Developing/Improving  Engagement in Therapy: Limited  Modes of Intervention: Clarification, Confrontation, Discussion, Education, Exploration, Limit-setting, Orientation, Problem-solving, Rapport Building, Dance movement psychotherapisteality Testing, Socialization and Support  Summary of Progress/Problems: The topic for group today was emotional regulation. This group focused on both positive and negative emotion identification and allowed group members to process ways to identify feelings, regulate negative emotions, and find healthy ways to manage internal/external emotions. Group members were asked to reflect on a time when their reaction to an emotion led to a negative outcome and explored how alternative responses using emotion regulation would have benefited them. Group members were also asked to discuss a time when emotion regulation was utilized when a negative emotion was experienced. Pt continues to be reserved in group discussion however participating when prompted. Pt identified anger as difficult to control. He reports that this stems from difficulty expressing his emotions in a healthy way.   Darrell CordialLauren Carter, LCSWA 07/10/2015 3:21 PM

## 2015-07-10 NOTE — Progress Notes (Signed)
D: Pt irritable on approach. Pt sluggish this morning. Pt stated that he slept well last night but woke up early d/t having a nightmare. Pt reported feeling angry this morning and wanting to curse out the [facility]HT who he was talking with this morning. Pt rates anxiety 9/10. Pt asked for ativan at 1118 for anger. Writer encouraged pt to use coping skills and writer will then consider administering Ativan at lunch time d/t pt appearing sluggish. Pt agreed and stated that he will take a shower. Pt denies suicidal thoughts. Pt denies HI. Pt stated that he having a hard time today with his anger and concentration. A: Medications administered as ordered per MD. Verbal support given. Pt encouraged to attend groups. 15 minute checks performed for safety.  R: Pt receptive to tx.

## 2015-07-10 NOTE — Progress Notes (Signed)
D   Pt endorses anxiety and depression   He has appropriate interaction with others and is compliant with treatment   He attends groups and participates well  A   Verbal support given   Medications administered and effectiveness monitored   Q 15 min checks    R   Pt safe at present and receptive to verbal support 

## 2015-07-10 NOTE — Progress Notes (Signed)
Adult Psychoeducational Group Note  Date:  07/10/2015 Time:  9:26 PM  Group Topic/Focus:  Wrap-Up Group:   The focus of this group is to help patients review their daily goal of treatment and discuss progress on daily workbooks.  Participation Level:  Active  Participation Quality:  Appropriate  Affect:  Appropriate  Cognitive:  Alert  Insight: Appropriate  Engagement in Group:  Distracting and Engaged  Modes of Intervention:  Discussion  Additional Comments:  Patient stated his day has been up and down. Patient stated he is working on controlling his anger and temperature.   Darrell [name] L Darrell [name] 07/10/2015, 9:26 PM

## 2015-07-10 NOTE — [facility] Group Notes (Signed)
[facility]  07/10/2015 8:45 AM  Participation Quality: Alert, Appropriate and Oriented  Mood/Affect: Flat, Withdrawn  Depression Rating: 2  Anxiety Rating: 8  Thoughts of Suicide: Pt denies SI/HI  Will you contract for safety? Yes  Current AVH: Pt denies  Plan for Discharge/Comments: Pt attended discharge planning group and actively participated in group. CSW discussed suicide prevention education with the group and encouraged them to discuss discharge planning and any relevant barriers. Pt participated minimally, reported that he did not want to talk today. Observed to be lethargic and sleeping.  Transportation Means: Pt reports access to transportation  Supports: No supports mentioned at this time  Chad CordialLauren Carter, LCSWA 07/10/2015 9:46 AM

## 2015-07-10 NOTE — Progress Notes (Signed)
Patient ID: Darrell [name], male   DOB: [birth year], 24 y.o.   MRN: [id number] [facility] MD Progress Note  07/10/2015  Darrell [name]  MRN:  [id number]  Subjective:   Today patient reports feeling more upset which he attributes to argument via phone with GF, whom he states seems to be " distant ". Denies medication side effects, but states he did not sleep as well last night , waking up several times at night, had a nightmare. He ruminates about recent events , such as hitting his face against wall prior to admission, and expresses a sense of shame and embarrassment . Today also describes increased anxiety when thinking about this, and states " it makes me want to drink alcohol " ( as a way of addressing anxiety)      Objective: Pt case discussed with treatment team and patient seen.  In general, mood has improved but does report ongoing depression and anxiety, with a tendency to ruminate about issues, sometimes leading to increased anxiety/panic. With his express consent, and in his presence, spoke with his girlfriend, Darrell [name], who states that patient seems to be gradually improving  compared to admission status and that she is supportive of his efforts to improve. She does state she remains angry  due to the damage he made to a wall on her apartment , but that she remains supportive and plans to visit him later today. Patient gaining insight into how negative affects sometimes lead to panic, anxiety symptoms , which in turn cause him to crave for alcohol. We discussed strategies to address anxiety such as deep breathing, muscle relaxation, journaling . Denies suicidal ideations, denies any violent or homicidal ideations . Responsive to support. Visible on unit, going to groups, interacting with peers .   Principal Problem: MDD (major depressive disorder), recurrent severe, without psychosis (HCC) Diagnosis:   Patient Active Problem List   Diagnosis Date Noted  . MDD (major depressive disorder),  recurrent severe, without psychosis (HCC) [F33.2] 07/06/2015  . Alcohol use disorder, severe, dependence (HCC) [F10.20] 07/03/2015   Total Time spent with patient:  30   minutes  Past Medical History:  Past Medical History  Diagnosis Date  . Depression    History reviewed. No pertinent past surgical history. Family History: History reviewed. No pertinent family history.  Social History:  History  Alcohol Use  . Yes    Comment: Drinks 2-5 days a week     History  Drug Use  . Yes  . Special: Marijuana    Comment: Uses "off and on"    Social History   Social History  . Marital Status: Single    Spouse Name: N/A  . Number of Children: N/A  . Years of Education: N/A   Social History Main Topics  . Smoking status: Current Every Day Smoker -- 1.00 packs/day    Types: Cigarettes  . Smokeless tobacco: None  . Alcohol Use: Yes     Comment: Drinks 2-5 days a week  . Drug Use: Yes    Special: Marijuana     Comment: Uses "off and on"  . Sexual Activity: Not Asked   Other Topics Concern  . None   Social History Narrative   Additional Social History:    Pain Medications: n/a Prescriptions: n/a Over the Counter: n/a History of alcohol / drug use?: Yes Longest period of sobriety (when/how long): 8 or 9 months in 2013 Negative Consequences of Use: Personal relationships, Financial Withdrawal Symptoms: Irritability, Aggressive/Assaultive ("If it's  been a couple of days and I haven't had anything to drinkn") Name of Substance 1: Beer 1 - Age of First Use: 11  years 1 - Amount (size/oz): Equivalent of up to 6 beers per day 1 - Frequency: 2 to 5 days per week 1 - Duration: Several hours, mostly at night 1 - Last Use / Amount: 07/02/2015 Name of Substance 2: Cannabis 2 - Age of First Use: approx 24 years old 2 - Amount (size/oz): States only "casually" if a friend has some weed; that he never purchases it 2 - Frequency: Approximately once a week 2 - Duration: A few hours  or hits? 2 - Last Use / Amount: "A few days ago".  Sleep:    States he did not sleep as well last night   Appetite:  improving  Current Medications: Current Facility-Administered Medications  Medication Dose Route Frequency Provider Last Rate Last Dose  . acetaminophen (TYLENOL) tablet 650 mg  650 mg Oral Q4H PRN Earney Navy, NP   650 mg at 07/07/15 2102  . alum & mag hydroxide-simeth (MAALOX/MYLANTA) 200-200-20 MG/5ML suspension 30 mL  30 mL Oral PRN Earney Navy, NP      . FLUoxetine (PROZAC) capsule 20 mg  20 mg Oral Daily Craige Cotta, MD   20 mg at 07/10/15 0828  . ibuprofen (ADVIL,MOTRIN) tablet 600 mg  600 mg Oral Q6H PRN Adonis Brook, NP   600 mg at 07/09/15 2139  . LORazepam (ATIVAN) tablet 1 mg  1 mg Oral Q8H PRN Craige Cotta, MD   1 mg at 07/10/15 1209  . mirtazapine (REMERON) tablet 7.5 mg  7.5 mg Oral QHS Rockey Situ Cobos, MD   7.5 mg at 07/09/15 2138  . nicotine (NICODERM CQ - dosed in mg/24 hours) patch 21 mg  21 mg Transdermal Daily Earney Navy, NP   21 mg at 07/10/15 0828  . OLANZapine zydis (ZYPREXA) disintegrating tablet 5 mg  5 mg Oral Daily PRN Beau Fanny, FNP   5 mg at 07/09/15 2139  . ondansetron (ZOFRAN) tablet 4 mg  4 mg Oral Q8H PRN Earney Navy, NP        Lab Results:  No results found for this or any previous visit (from the past 48 hour(s)).  Physical Findings: AIMS: Facial and Oral Movements Muscles of Facial Expression: None, normal Lips and Perioral Area: None, normal Jaw: None, normal Tongue: None, normal,Extremity Movements Upper (arms, wrists, hands, fingers): None, normal Lower (legs, knees, ankles, toes): None, normal, Trunk Movements Neck, shoulders, hips: None, normal, Overall Severity Severity of abnormal movements (highest score from questions above): None, normal Incapacitation due to abnormal movements: None, normal Patient's awareness of abnormal movements (rate only patient's report): No Awareness,  Dental Status Current problems with teeth and/or dentures?: No Does patient usually wear dentures?: No  CIWA:  CIWA-Ar Total: 1 COWS:  COWS Total Score: 2  Musculoskeletal: Strength & Muscle Tone: within normal limits- no tremors , no diaphoresis, no psychomotor agitation Gait & Station: normal Patient leans: N/A  Psychiatric Specialty Exam: Review of Systems  Psychiatric/Behavioral: Positive for depression and substance abuse. Negative for suicidal ideas and hallucinations. The patient is nervous/anxious.   All other systems reviewed and are negative.  no chest pain, no SOB, no nausea or vomiting , describes lose stools .  Blood pressure 117/76, pulse 108, temperature 97.5 F (36.4 C), temperature source Oral, resp. rate 18, height  (1.905 m), weight 210 lb (95.255  kg), SpO2 100 %.Body mass index is 26.25 kg/(m^2).  General Appearance: Fairly Groomed  Patent attorneyye Contact::  Fair  Speech:  Normal Rate  Volume:  Normal  Mood:   Depressed, but improving compared to admission presentation   Affect:    constricted, anxious today   Thought Process:  Goal Directed and Linear  Orientation:  Full (Time, Place, and Person)  Thought Content:  denies hallucinations, no delusions, not internally preoccupied   Suicidal Thoughts:  No at this time denies suicidal ideations and contracts for safety on unit   Homicidal Thoughts:  No- specifically also denies any violent ideations towards GF or anyone else   Memory:  recent and remote grossly intact   Judgement:  Other:  improving   Insight:  improving   Psychomotor Activity:  Normal-   Concentration:  Good  Recall:  Good  Fund of Knowledge:Good  Language: Good  Akathisia:  Negative  Handed:  Right  AIMS (if indicated):     Assets:  ArchitectCommunication Skills Financial Resources/Insurance Physical Health Resilience  ADL's:  Intact  Cognition: WNL  Sleep:  Number of Hours: 6.5    Assessment -    Today more anxious and constricted in the context  of feeling his GF has become more emotionally distant. At his request/ in his presence  we spoke with her via phone and she reiterated support for him. Patient gaining insight into pattern of increased anxiety when dealing with symptoms such as sadness or anger, and tendency to deal with anxiety by drinking alcohol at times . Did not respond well to Remeron, slept more poorly , so will D/C. Thus far tolerating Prozac well .  Treatment Plan Summary: Daily contact with patient to assess and evaluate symptoms and progress in treatment, Medication management, Plan inpatient treatment  and medications as below  Encourage ongoing milieu, group participation to work on symptom reduction and coping skills  Continue to encourage and support abstinence, relapse prevention efforts Continue Prozac 20 mgrs QDAY for management of depression and anxiety Start Abilify 2 mgrs QDAY as augmentation strategy to address ongoing depression  Restart Trazodone 50 mgrs QHS PRN for insomnia as needed  D/C Remeron Continue Ativan 1 mgr Q 8 hour  PRNS for severe anxiety or potential alcohol WDL symptoms  COBOS, FERNANDO,  MD  07/10/2015, 10:30 AM

## 2015-07-11 MED ORDER — ACAMPROSATE CALCIUM 333 MG PO TBEC
666.0000 mg | DELAYED_RELEASE_TABLET | Freq: Three times a day (TID) | ORAL | Status: DC
  Administered 2015-07-11 – 2015-07-13 (×6): 666 mg via ORAL
  Filled 2015-07-11 (×2): qty 2
  Filled 2015-07-11: qty 42
  Filled 2015-07-11: qty 2
  Filled 2015-07-11: qty 42
  Filled 2015-07-11: qty 2
  Filled 2015-07-11: qty 42
  Filled 2015-07-11: qty 2
  Filled 2015-07-11: qty 42
  Filled 2015-07-11 (×2): qty 2
  Filled 2015-07-11: qty 42
  Filled 2015-07-11: qty 2

## 2015-07-11 MED ORDER — TRAZODONE HCL 50 MG PO TABS
50.0000 mg | ORAL_TABLET | Freq: Every evening | ORAL | Status: DC | PRN
  Administered 2015-07-11 – 2015-07-12 (×2): 50 mg via ORAL
  Filled 2015-07-11 (×2): qty 1
  Filled 2015-07-11: qty 7

## 2015-07-11 NOTE — Plan of Care (Signed)
Problem: Consults Goal: Depression Patient Education See Patient Education Module for education specifics.  Outcome: Progressing Nurse discussed depression/coping skills with patient.        

## 2015-07-11 NOTE — [facility] Group Notes (Signed)
[facility] Group Therapy 07/11/2015 1:15pm  Type of Therapy: Mental Health Association Presentation  Pt did not attend, declined invitation.   Chad CordialLauren Carter, LCSWA 07/11/2015 1:50 PM

## 2015-07-11 NOTE — [facility] Group Notes (Signed)
Adult Psychoeducational Group Note  Date:  07/11/2015 Time:  9:41 PM  Group Topic/Focus:  Wrap-Up Group:   The focus of this group is to help patients review their daily goal of treatment and discuss progress on daily workbooks.  Participation Level:  Active  Participation Quality:  Appropriate  Affect:  Appropriate  Cognitive:  Appropriate  Insight: Appropriate  Engagement in Group:  Engaged  Modes of Intervention:  Discussion  Additional Comments:  Patient stated his day was okay.  He expressed that he was really anxious today.  He said he is discharging on Saturday around noon.  His mom and sister did not visit him today and he was looking forward to that visit.  Caroll RancherLindsay, Rhena Glace A 07/11/2015, 9:41 PM

## 2015-07-11 NOTE — Progress Notes (Signed)
Patient ID: Darrell [name], male   DOB: [birth year], 24 y.o.   MRN: [id number] D: Patient in dayroom on approach. Pt reports increase anxiety over his mother and sister not visiting. Pt reports he waiting and hoping they will come but did not show or even called. Pt mood and affect appeared depressed and anxious. Pt reports he is tolerating medication well. Pt denies SI/HI/AVH and pain. Pt attended and participated in evening wrap up group. Cooperative with assessment.    A: Met with pt 1:1. Medications administered as prescribed. Support and encouragement provided. Pt encouraged to discuss feelings and come to staff with any question or concerns.   R: Patient remains safe and complaint with medications.

## 2015-07-11 NOTE — [facility] Group Notes (Signed)

## 2015-07-11 NOTE — Progress Notes (Signed)
D:  Patient's self inventory sheet, patient has fair sleep, sleep medication is helpful.  Fair appetite, normal energy level, poor concentration.  Rated depression 2, hopeless 1, anxiety 6.  Withdrawals, diarrhea, cravings, agitation, irritability.  Denied SI.  Physical problems headache.  Goal is checking his emotions.  Plans to be mindful in the moment.  Does have discharge plans.  No problems anticipated after discharge. A:  Medications administered per MD orders.  Emotional support and encouragement given patient. R:  Denied SI and HI, contracts for safety.  Denied A/V hallucinations.  Safety maintained with 15 minute checks.

## 2015-07-11 NOTE — Tx Team (Signed)
Interdisciplinary Treatment Plan Update (Adult) Date: 07/11/2015   Date: 07/11/2015 1:50 PM  Progress in Treatment:  Attending groups: Yes  Participating in groups: Yes  Taking medication as prescribed: Yes  Tolerating medication: Yes  Family/Significant othe contact made: No, contact attempts unsuccessful Patient understands diagnosis: Yes Discussing patient identified problems/goals with staff: Yes  Medical problems stabilized or resolved: Yes  Denies suicidal/homicidal ideation: Yes Patient has not harmed self or Others: Yes   New problem(s) identified: None identified at this time.   Discharge Plan or Barriers: Pt will return home and follow-up with [facility]  Additional comments: n/a   Reason for Continuation of Hospitalization:  Depression Medication stabilization Suicidal ideation   Estimated length of stay: 2-3 days  Review of initial/current patient goals per problem list:   1.  Goal(s): Patient will participate in aftercare plan  Met:  Yes  Target date: 3-5 days from date of admission   As evidenced by: Patient will participate within aftercare plan AEB aftercare provider and housing plan at discharge being identified.   07/04/15: Pt will discharge home and follow-up with [facility] [place]  2.  Goal (s): Patient will exhibit decreased depressive symptoms and suicidal ideations.  Met:  Progressing  Target date: 3-5 days from date of admission   As evidenced by: Patient will utilize self rating of depression at 3 or below and demonstrate decreased signs of depression or be deemed stable for discharge by MD. 07/04/15: Pt was admitted with symptoms of depression, rating 10/10. Pt continues to present with flat affect and depressive symptoms.  Pt will demonstrate decreased symptoms of depression and rate depression at 3/10 or lower prior to discharge. 07/09/15: Pt rates depression at 7/10. 07/11/15: Pt reports that  his depression is improving.  Attendees:  Patient:    Family:    Physician: Dr. Parke Poisson, MD  07/11/2015 1:50 PM  Nursing: Lars Pinks, RN Case manager  07/11/2015 1:50 PM  Clinical Social Worker Peri Maris, Latanya Presser, MSW 07/11/2015 1:50 PM  Other: Lucinda Dell, Beverly Sessions Liasion 07/11/2015 1:50 PM  Clinical: Grayland Ormond RN 07/11/2015 1:50 PM  Other: , RN Charge Nurse 07/11/2015 1:50 PM  Other:     Peri Maris, Latanya Presser MSW

## 2015-07-11 NOTE — Progress Notes (Signed)
Patient ID: [name], male   DOB: 1990-10-27, 24 y.o.   MRN: [id number] [facility] MD Progress Note  07/11/2015  Darrell [name]  MRN:  [id number]  Subjective:   Today patient  Presents with slightly decreased anxiety, and a partially improved mood. Yesterday had been feeling more depressed and acutely upset, tearful in the context of an argument with his girlfriend on phone . Although improved today, still reports significant anxiety, worry " about how I will do when I go back home" , and also  states he has  been  having increased cravings for alcohol , as " this is the way I would handle stress ". States he had been very apprehensive about GF's visit last night, but that visit went well/ better than expected . Denies medication side effects. He states he does not feel ready for discharge yet, " because I am still anxious ".   Objective: Pt case discussed with treatment team and patient seen.  Today presents partially improved compared to yesterday - reports still feeling anxious, worried, and acknowledges that these feelings cause him to crave for alcohol. We discussed medications- denies side effects at this time. States that  He feels they are working partially thus far. He is interested in medication options to decrease alcohol cravings, and agrees to start Campral . We discussed medication side effects. Visible in day room, going to some groups, interacting appropriately with selected peers .    Principal Problem: MDD (major depressive disorder), recurrent severe, without psychosis (HCC) Diagnosis:   Patient Active Problem List   Diagnosis Date Noted  . MDD (major depressive disorder), recurrent severe, without psychosis (HCC) [F33.2] 07/06/2015  . Alcohol use disorder, severe, dependence (HCC) [F10.20] 07/03/2015   Total Time spent with patient:  25   minutes  Past Medical History:  Past Medical History  Diagnosis Date  . Depression    History reviewed. No pertinent past  surgical history. Family History: History reviewed. No pertinent family history.  Social History:  History  Alcohol Use  . Yes    Comment: Drinks 2-5 days a week     History  Drug Use  . Yes  . Special: Marijuana    Comment: Uses "off and on"    Social History   Social History  . Marital Status: Single    Spouse Name: N/A  . Number of Children: N/A  . Years of Education: N/A   Social History Main Topics  . Smoking status: Current Every Day Smoker -- 1.00 packs/day    Types: Cigarettes  . Smokeless tobacco: None  . Alcohol Use: Yes     Comment: Drinks 2-5 days a week  . Drug Use: Yes    Special: Marijuana     Comment: Uses "off and on"  . Sexual Activity: Not Asked   Other Topics Concern  . None   Social History Narrative   Additional Social History:    Pain Medications: n/a Prescriptions: n/a Over the Counter: n/a History of alcohol / drug use?: Yes Longest period of sobriety (when/how long): 8 or 9 months in 2013 Negative Consequences of Use: Personal relationships, Financial Withdrawal Symptoms: Irritability, Aggressive/Assaultive ("If it's been a couple of days and I haven't had anything to drinkn") Name of Substance 1: Beer 1 - Age of First Use: 11  years 1 - Amount (size/oz): Equivalent of up to 6 beers per day 1 - Frequency: 2 to 5 days per week 1 - Duration: Several hours, mostly at  night 1 - Last Use / Amount: 07/02/2015 Name of Substance 2: Cannabis 2 - Age of First Use: approx 24 years old 2 - Amount (size/oz): States only "casually" if a friend has some weed; that he never purchases it 2 - Frequency: Approximately once a week 2 - Duration: A few hours or hits? 2 - Last Use / Amount: "A few days ago".  Sleep:    States he did not sleep as well last night   Appetite:  improving  Current Medications: Current Facility-Administered Medications  Medication Dose Route Frequency Provider Last Rate Last Dose  . acamprosate (CAMPRAL) tablet 666 mg   666 mg Oral TID WC Rockey SituFernando A Saeed Toren, MD      . acetaminophen (TYLENOL) tablet 650 mg  650 mg Oral Q4H PRN Earney NavyJosephine C Onuoha, NP   650 mg at 07/07/15 2102  . alum & mag hydroxide-simeth (MAALOX/MYLANTA) 200-200-20 MG/5ML suspension 30 mL  30 mL Oral PRN Earney NavyJosephine C Onuoha, NP      . ARIPiprazole (ABILIFY) tablet 2 mg  2 mg Oral Daily Craige CottaFernando A Kenyia Wambolt, MD   2 mg at 07/11/15 0806  . FLUoxetine (PROZAC) capsule 20 mg  20 mg Oral Daily Craige CottaFernando A Neah Sporrer, MD   20 mg at 07/11/15 0806  . ibuprofen (ADVIL,MOTRIN) tablet 600 mg  600 mg Oral Q6H PRN Adonis BrookSheila Agustin, NP   600 mg at 07/09/15 2139  . LORazepam (ATIVAN) tablet 1 mg  1 mg Oral Q6H PRN Craige CottaFernando A Olanda Downie, MD   1 mg at 07/11/15 1505  . nicotine (NICODERM CQ - dosed in mg/24 hours) patch 21 mg  21 mg Transdermal Daily Earney NavyJosephine C Onuoha, NP   21 mg at 07/11/15 0807  . ondansetron (ZOFRAN) tablet 4 mg  4 mg Oral Q8H PRN Earney NavyJosephine C Onuoha, NP      . traZODone (DESYREL) tablet 50 mg  50 mg Oral QHS Craige CottaFernando A Talon Witting, MD   50 mg at 07/10/15 2129    Lab Results:  No results found for this or any previous visit (from the past 48 hour(s)).  Physical Findings: AIMS: Facial and Oral Movements Muscles of Facial Expression: None, normal Lips and Perioral Area: None, normal Jaw: None, normal Tongue: None, normal,Extremity Movements Upper (arms, wrists, hands, fingers): None, normal Lower (legs, knees, ankles, toes): None, normal, Trunk Movements Neck, shoulders, hips: None, normal, Overall Severity Severity of abnormal movements (highest score from questions above): None, normal Incapacitation due to abnormal movements: None, normal Patient's awareness of abnormal movements (rate only patient's report): No Awareness, Dental Status Current problems with teeth and/or dentures?: No Does patient usually wear dentures?: No  CIWA:  CIWA-Ar Total: 1 COWS:  COWS Total Score: 1  Musculoskeletal: Strength & Muscle Tone: within normal limits- no tremors , no  diaphoresis, no psychomotor agitation Gait & Station: normal Patient leans: N/A  Psychiatric Specialty Exam: Review of Systems  Psychiatric/Behavioral: Positive for depression and substance abuse. Negative for suicidal ideas and hallucinations. The patient is nervous/anxious.   All other systems reviewed and are negative.  no chest pain, no SOB, no nausea or vomiting , describes lose stools .  Blood pressure 126/82, pulse 78, temperature 97.6 F (36.4 C), temperature source Oral, resp. rate 18, height 6\' 3"  (1.905 m), weight 210 lb (95.255 kg), SpO2 100 %.Body mass index is 26.25 kg/(m^2).  General Appearance:  Improved grooming   Eye Contact::   Improved   Speech:  Normal Rate  Volume:  Normal  Mood:  Still depressed, anxious, but improved compared to yesterday   Affect:     Improving , but still feeling subjectively anxious   Thought Process:  Goal Directed and Linear  Orientation:  Full (Time, Place, and Person)  Thought Content:  denies hallucinations, no delusions, not internally preoccupied   Suicidal Thoughts:  No at this time denies suicidal ideations and contracts for safety on unit   Homicidal Thoughts:  No- specifically also denies any violent ideations towards GF or anyone else   Memory:  recent and remote grossly intact   Judgement:  Other:  improving   Insight:  improving   Psychomotor Activity:  Normal-   Concentration:  Good  Recall:  Good  Fund of Knowledge:Good  Language: Good  Akathisia:  Negative  Handed:  Right  AIMS (if indicated):     Assets:  Architect Physical Health Resilience  ADL's:  Intact  Cognition: WNL  Sleep:  Number of Hours: 6.25    Assessment -     Today patient is partially improved compared to yesterday. Less severely anxious, and mood partially improved . Denies SI, behavior in good control. At this time denies medication side effects. States he remains anxious about discharge, but more  responsive to support, encouragement . He is experiencing some cravings for alcohol and agrees to Campral trial.   Treatment Plan Summary: Daily contact with patient to assess and evaluate symptoms and progress in treatment, Medication management, Plan inpatient treatment  and medications as below  Encourage ongoing milieu, group participation to work on symptom reduction and coping skills  Continue to encourage and support abstinence, relapse prevention efforts Continue Prozac 20 mgrs QDAY for management of depression and anxiety Continue Abilify 2 mgrs QDAY as augmentation strategy to address ongoing depression  Continue  Trazodone 50 mgrs QHS PRN for insomnia as needed  Start Campral 666 mgrs TID to help decrease alcohol related cravings . Continue Ativan 1 mgr Q 8 hour  PRNS for severe anxiety or potential alcohol WDL symptoms  Alysson Geist,  MD  07/11/2015, 10:30 AM

## 2015-07-12 MED ORDER — FLUOXETINE HCL 10 MG PO CAPS
30.0000 mg | ORAL_CAPSULE | Freq: Every day | ORAL | Status: DC
  Administered 2015-07-13: 30 mg via ORAL
  Filled 2015-07-12 (×2): qty 3
  Filled 2015-07-12 (×2): qty 21

## 2015-07-12 NOTE — Plan of Care (Signed)
Problem: Alteration in mood & ability to function due to Goal: STG-Patient will comply with prescribed medication regimen (Patient will comply with prescribed medication regimen)  Outcome: Progressing Pt took all medications as ordered when offered. Denied adverse drug reactions when assessed. Safety maintained.

## 2015-07-12 NOTE — [facility] Group Notes (Signed)
[facility]  07/12/2015 8:45 AM  Participation Quality: Alert, Appropriate and Oriented  Mood/Affect: Flat  Depression Rating: 2  Anxiety Rating: 7  Thoughts of Suicide: Pt denies SI/HI  Will you contract for safety? Yes  Current AVH: Pt denies  Plan for Discharge/Comments: Pt attended discharge planning group and actively participated in group. CSW discussed suicide prevention education with the group and encouraged them to discuss discharge planning and any relevant barriers. Pt continues to be minimal in group discussion. Reports that he is anxious "due to a lot of different things."  Transportation Means: Pt reports access to transportation  Supports: No supports mentioned at this time  Chad CordialLauren Carter, LCSWA 07/12/2015 4:29 PM

## 2015-07-12 NOTE — Progress Notes (Signed)
  [facility] :  Will you be returning to the same living situation after discharge:  Yes,  Pt returning to his girlfriend's house At discharge, do you have transportation home?: Yes,  Pt girlfriend to provide transportation Do you have the ability to pay for your medications: Yes,   provided with prescriptions and supply  Release of information consent forms completed and in the chart;  Patient's signature needed at discharge.  Patient to Follow up at: Follow-up Information    Follow up with [facility] On 07/15/2015.   Why:  at 1:00pm for therapy.   Contact information:   [facility] [address]  [address]  [place], [place] [address] PHONE: [phone] FAX: [phone]      [facility]   Specialty:  Behavioral Health   Why:  Your first appointment will be a walk-in appointment; go between 8am-3pm Monday-Friday within 7 days of your discharge for meds. It is recommended that you go early to avoid long wait times. Please inform staff that you are there for a hospital discharge   Contact information:   [address] [place] [address] [phone]       Next level of care provider [facility] Link:no  Patient denies SI/HI: Yes,  Pt denies    Safety Planning and Suicide Prevention discussed: Yes,  with Pt; contact attempts with family unsuccessful  Have you used any form of tobacco in the last 30 days? (Cigarettes, Smokeless Tobacco, Cigars, and/or Pipes): Yes  Has patient been referred to the Quitline?: Patient refused referral  Elaina HoopsCarter, Reatha Sur M 07/12/2015, 4:54 PM

## 2015-07-12 NOTE — [facility] Group Notes (Signed)
Adult Psychoeducational Group Note  Date:  07/12/2015 Time:  10:10 PM  Group Topic/Focus:  Wrap-Up Group:   The focus of this group is to help patients review their daily goal of treatment and discuss progress on daily workbooks.  Participation Level:  Active  Participation Quality:  Appropriate  Affect:  Appropriate  Cognitive:  Appropriate  Insight: Good  Engagement in Group:  Engaged  Modes of Intervention:  Discussion  Additional Comments:  Patient stated he is discharging tomorrow to his mom's house.  He stated he wants to get [name] home cooked meal from his mom.  He stated overall his day was "steady".  He also expressed he had [name] good visit with his mother.  Darrell RancherLindsay, Darrell [name] 07/12/2015, 10:10 PM

## 2015-07-12 NOTE — [facility] Group Notes (Signed)
[facility] LCSW Group Therapy 07/12/2015 1:15pm  Type of Therapy: Group Therapy- Feelings Around Relapse and Recovery  Participation Level: Minimal  Participation Quality:  Reserved  Affect:  Appropriate  Cognitive: Alert and Oriented   Insight:  Developing   Engagement in Therapy: Developing/Improving and Engaged   Modes of Intervention: Clarification, Confrontation, Discussion, Education, Exploration, Limit-setting, Orientation, Problem-solving, Rapport Building, Dance movement psychotherapisteality Testing, Socialization and Support  Summary of Progress/Problems: The topic for today was feelings about relapse. The group discussed what relapse prevention is to them and identified triggers that they are on the path to relapse. Members also processed their feeling towards relapse and were able to relate to common experiences. Group also discussed coping skills that can be used for relapse prevention.  Pt expresses that his relapse is often triggered by not talking about his feelings. Pt was able to relate to other comments made by peers and expressed a need to find a good support group.   Therapeutic Modalities:   Cognitive Behavioral Therapy Solution-Focused Therapy Assertiveness Training Relapse Prevention Therapy    Darrell SprinklesLauren [name], LCSWA [phone] 07/12/2015 4:30 PM

## 2015-07-12 NOTE — Progress Notes (Signed)
Patient ID: Darrell BachMichael E [name], male   DOB: [birth year], 24 y.o.   MRN: [id number] [facility]  07/12/2015  Darrell [name]  MRN:  [id number]  Subjective:   Today patient  States he is partially better but continues to report anxiety. States he is worried that his mother did not visit him last night and that he has been unable to reach her via phone today thus far. He is expecting for her to visit today. Denies medication side effects, except for mild sedation. At this time denies cravings or desire to drink.   Objective: Pt case discussed with treatment team and patient seen.  Patient reports some ongoing anxiety. He states it is difficult for him to separate anxiety from depression symptoms and states " they are about the same ". While he does acknowledge partial improvement compared to admission, he tends to remain anxious, and staff/ writer have noted he is sensitive to milieu environment, with increased anxiety and ruminations when he perceives other patients being irritable or any interpersonal tension. He denies any suicidal ideations, denies any self injurious thoughts, states he is looking forward to returning home soon. He is tolerating medications well, but at this time feels that they are still not working entirely. Denies current cravings for alcohol. He acknowledges a history of alcohol abuse, and feels he often drinks to address anxiety, " feel more mellow". No disruptive or agitated behaviors on unit, going to groups.   Principal Problem: MDD (major depressive disorder), recurrent severe, without psychosis (HCC) Diagnosis:   Patient Active Problem List   Diagnosis Date Noted  . MDD (major depressive disorder), recurrent severe, without psychosis (HCC) [F33.2] 07/06/2015  . Alcohol use disorder, severe, dependence (HCC) [F10.20] 07/03/2015   Total Time spent with patient:  20   minutes  Past Medical History:  Past Medical History  Diagnosis Date  . Depression     History reviewed. No pertinent past surgical history. Family History: History reviewed. No pertinent family history.  Social History:  History  Alcohol Use  . Yes    Comment: Drinks 2-5 days a week     History  Drug Use  . Yes  . Special: Marijuana    Comment: Uses "off and on"    Social History   Social History  . Marital Status: Single    Spouse Name: N/A  . Number of Children: N/A  . Years of Education: N/A   Social History Main Topics  . Smoking status: Current Every Day Smoker -- 1.00 packs/day    Types: Cigarettes  . Smokeless tobacco: None  . Alcohol Use: Yes     Comment: Drinks 2-5 days a week  . Drug Use: Yes    Special: Marijuana     Comment: Uses "off and on"  . Sexual Activity: Not Asked   Other Topics Concern  . None   Social History Narrative   Additional Social History:    Pain Medications: n/a Prescriptions: n/a Over the Counter: n/a History of alcohol / drug use?: Yes Longest period of sobriety (when/how long): 8 or 9 months in 2013 Negative Consequences of Use: Personal relationships, Financial Withdrawal Symptoms: Irritability, Aggressive/Assaultive ("If it's been a couple of days and I haven't had anything to drinkn") Name of Substance 1: Beer 1 - Age of First Use: 11  years 1 - Amount (size/oz): Equivalent of up to 6 beers per day 1 - Frequency: 2 to 5 days per week 1 - Duration: Several hours, mostly  at night 1 - Last Use / Amount: 07/02/2015 Name of Substance 2: Cannabis 2 - Age of First Use: approx 24 years old 2 - Amount (size/oz): States only "casually" if a friend has some weed; that he never purchases it 2 - Frequency: Approximately once a week 2 - Duration: A few hours or hits? 2 - Last Use / Amount: "A few days ago".  Sleep:    States he did not sleep as well last night   Appetite:  improving  Current Medications: Current Facility-Administered Medications  Medication Dose Route Frequency Provider Last Rate Last Dose  .  acamprosate (CAMPRAL) tablet 666 mg  666 mg Oral TID WC Craige Cotta, MD   666 mg at 07/12/15 1257  . acetaminophen (TYLENOL) tablet 650 mg  650 mg Oral Q4H PRN Earney Navy, NP   650 mg at 07/07/15 2102  . alum & mag hydroxide-simeth (MAALOX/MYLANTA) 200-200-20 MG/5ML suspension 30 mL  30 mL Oral PRN Earney Navy, NP      . ARIPiprazole (ABILIFY) tablet 2 mg  2 mg Oral Daily Craige Cotta, MD   2 mg at 07/12/15 0816  . FLUoxetine (PROZAC) capsule 20 mg  20 mg Oral Daily Craige Cotta, MD   20 mg at 07/12/15 0816  . ibuprofen (ADVIL,MOTRIN) tablet 600 mg  600 mg Oral Q6H PRN Adonis Brook, NP   600 mg at 07/09/15 2139  . LORazepam (ATIVAN) tablet 1 mg  1 mg Oral Q6H PRN Craige Cotta, MD   1 mg at 07/12/15 0818  . nicotine (NICODERM CQ - dosed in mg/24 hours) patch 21 mg  21 mg Transdermal Daily Earney Navy, NP   21 mg at 07/12/15 0819  . ondansetron (ZOFRAN) tablet 4 mg  4 mg Oral Q8H PRN Earney Navy, NP      . traZODone (DESYREL) tablet 50 mg  50 mg Oral QHS PRN Craige Cotta, MD   50 mg at 07/11/15 2140    Lab Results:  No results found for this or any previous visit (from the past 48 hour(s)).  Physical Findings: AIMS: Facial and Oral Movements Muscles of Facial Expression: None, normal Lips and Perioral Area: None, normal Jaw: None, normal Tongue: None, normal,Extremity Movements Upper (arms, wrists, hands, fingers): None, normal Lower (legs, knees, ankles, toes): None, normal, Trunk Movements Neck, shoulders, hips: None, normal, Overall Severity Severity of abnormal movements (highest score from questions above): None, normal Incapacitation due to abnormal movements: None, normal Patient's awareness of abnormal movements (rate only patient's report): No Awareness, Dental Status Current problems with teeth and/or dentures?: No Does patient usually wear dentures?: No  CIWA:  CIWA-Ar Total: 1 COWS:  COWS Total Score:  1  Musculoskeletal: Strength & Muscle Tone: within normal limits- no tremors , no diaphoresis, no psychomotor agitation Gait & Station: normal Patient leans: N/A  Psychiatric Specialty Exam: Review of Systems  Psychiatric/Behavioral: Positive for depression and substance abuse. Negative for suicidal ideas and hallucinations. The patient is nervous/anxious.   All other systems reviewed and are negative.  no chest pain, no SOB, no nausea or vomiting , describes lose stools .  Blood pressure 133/82, pulse 100, temperature 97.6 F (36.4 C), temperature source Oral, resp. rate 20, height  (1.905 m), weight 210 lb (95.255 kg), SpO2 100 %.Body mass index is 26.25 kg/(m^2).  General Appearance:  Improved grooming   Eye Contact::   Improved   Speech:  Normal Rate  Volume:  Normal  Mood:     Gradually improving, but still mildly depressed and anxious    Affect:    Lingering anxiety.  Thought Process:  Goal Directed and Linear  Orientation:  Full (Time, Place, and Person)  Thought Content:  denies hallucinations, no delusions, not internally preoccupied   Suicidal Thoughts:  No at this time denies suicidal ideations and contracts for safety on unit   Homicidal Thoughts:  No- specifically also denies any violent ideations towards GF or anyone else   Memory:  recent and remote grossly intact   Judgement:  Other:  improving   Insight:  improving   Psychomotor Activity:  Normal-   Concentration:  Good  Recall:  Good  Fund of Knowledge:Good  Language: Good  Akathisia:  Negative  Handed:  Right  AIMS (if indicated):     Assets:  Architect Physical Health Resilience  ADL's:  Intact  Cognition: WNL  Sleep:  Number of Hours: 6.25    Assessment -   Patient partially improved, less depressed. Still experiencing anxiety, and tends to have chronic anxiety symptoms, to include feeling anxious  Related to vague milieu tension.  Mood is better and at  this time denies any suicidal ideations. He is tolerating medications well, although reports slight sedation . At this time he is fully alert and attentive. Denies current cravings for alcohol- tolerating Campral trial well thus far.  Treatment Plan Summary: Daily contact with patient to assess and evaluate symptoms and progress in treatment, Medication management, Plan inpatient treatment  and medications as below  Encourage ongoing milieu, group participation to work on symptom reduction and coping skills  Continue to encourage and support abstinence, relapse prevention efforts Increase   Prozac  To 30 mgrs QDAY for management of depression and anxiety Continue Abilify 2 mgrs QDAY as augmentation strategy to address ongoing depression  Continue  Trazodone 50 mgrs QHS PRN for insomnia as needed  Continue  Campral 666 mgrs TID to help decrease alcohol related cravings . Continue Ativan 1 mgr Q 8 hour  PRNS for severe anxiety or potential alcohol WDL symptoms  Eddye Broxterman,  MD  07/12/2015, 10:30 AM

## 2015-07-12 NOTE — Progress Notes (Signed)
D: Pt visible in milieu for majority of this shift. Attended scheduled groups, observed interacting well with peers and staff appropriately.  Affect congruent with mood pleasant. Pt reported sleeping good last night with normal mood and fair appetite on self inventory sheet. Rated his depression 1/10, hopelessness 0/10 and anxiety 7/10. Pt's goal for today is to "work on my discharge plan". Attended scheduled groups. Went off unit with peers for activities in courtyard, returned without issues.  A: Scheduled and PRN medications (Ativan 1 mg PO-anxiety) administered as ordered. Availability, support and encouragement provided to pt throughout this shift. Pt informed of changes to his medications (Prozac increased from 20 mg PO to 30 mg PO daily). Safety maintained on Q 15 minutes checks as ordered without events thus far this shift.  R: Pt receptive to care. Voice no concerns at this time. Verbalized understanding related to medication changes. Remains safe on and off unit.

## 2015-07-12 NOTE — Tx Team (Signed)
Interdisciplinary Treatment Plan Update (Adult) Date: 07/12/2015   Date: 07/12/2015 4:53 PM  Progress in Treatment:  Attending groups: Yes  Participating in groups: Yes  Taking medication as prescribed: Yes  Tolerating medication: Yes  Family/Significant othe contact made: No, contact attempts unsuccessful Patient understands diagnosis: Yes Discussing patient identified problems/goals with staff: Yes  Medical problems stabilized or resolved: Yes  Denies suicidal/homicidal ideation: Yes Patient has not harmed self or Others: Yes   New problem(s) identified: None identified at this time.   Discharge Plan or Barriers: Pt will return home and follow-up with [facility]  Additional comments: n/a   Reason for Continuation of Hospitalization:  Depression Medication stabilization Suicidal ideation   Estimated length of stay: 0 days; pt stable for DC  Review of initial/current patient goals per problem list:   1.  Goal(s): Patient will participate in aftercare plan  Met:  Yes  Target date: 3-5 days from date of admission   As evidenced by: Patient will participate within aftercare plan AEB aftercare provider and housing plan at discharge being identified.   07/04/15: Pt will discharge home and follow-up with [facility] [place]  2.  Goal (s): Patient will exhibit decreased depressive symptoms and suicidal ideations.  Met:  Adequate for DC  Target date: 3-5 days from date of admission   As evidenced by: Patient will utilize self rating of depression at 3 or below and demonstrate decreased signs of depression or be deemed stable for discharge by MD. 07/04/15: Pt was admitted with symptoms of depression, rating 10/10. Pt continues to present with flat affect and depressive symptoms.  Pt will demonstrate decreased symptoms of depression and rate depression at 3/10 or lower prior to discharge. 07/09/15: Pt rates depression at  7/10. 07/11/15: Pt reports that his depression is improving. 07/12/2015: MD feels that Pt's symptoms have decreased to the point that they can be managed in an outpatient setting.   Attendees:  Patient:    Family:    Physician: Dr. Parke Poisson, MD  07/12/2015 4:53 PM  Nursing: Lars Pinks, RN Case manager  07/12/2015 4:53 PM  Clinical Social Worker Norman Clay, MSW 07/12/2015 4:53 PM  Other: Lucinda Dell, Beverly Sessions Liasion 07/12/2015 4:53 PM  Clinical: Keane Police, RN 07/12/2015 4:53 PM  Other: , RN Charge Nurse 07/12/2015 4:53 PM  Other:     Peri Maris, [place] MSW

## 2015-07-13 DIAGNOSIS — F332 Major depressive disorder, recurrent severe without psychotic features: Secondary | ICD-10-CM | POA: Insufficient documentation

## 2015-07-13 MED ORDER — FLUOXETINE HCL 10 MG PO CAPS: 30.0000 mg | ORAL_CAPSULE | Freq: Every day | ORAL | Status: DC

## 2015-07-13 MED ORDER — ARIPIPRAZOLE 2 MG PO TABS: 2.0000 mg | ORAL_TABLET | Freq: Every day | ORAL | Status: DC

## 2015-07-13 MED ORDER — ACAMPROSATE CALCIUM 333 MG PO TBEC
666.0000 mg | DELAYED_RELEASE_TABLET | Freq: Three times a day (TID) | ORAL | Status: DC
Start: 2015-07-13 — End: 2021-05-26

## 2015-07-13 MED ORDER — NICOTINE 21 MG/24HR TD PT24: 21.0000 mg | MEDICATED_PATCH | Freq: Every day | TRANSDERMAL | Status: DC

## 2015-07-13 MED ORDER — TRAZODONE HCL 50 MG PO TABS: 50.0000 mg | ORAL_TABLET | Freq: Every evening | ORAL | Status: DC | PRN

## 2015-07-13 NOTE — [facility] Suicide Risk Assessment (Signed)
[facility]   Demographic Factors:  24 year old male ,   Total Time spent with patient: 30 minutes  Musculoskeletal: Strength & Muscle Tone: within normal limits Gait & Station: normal Patient leans: N/A  Psychiatric Specialty Exam: Physical Exam  ROS  Blood pressure 126/75, pulse 99, temperature 98.2 F (36.8 C), temperature source Oral, resp. rate 18, height 6\' 2"  (1.88 m), weight 226 lb (102.513 kg), SpO2 100 %.Body mass index is 29 kg/(m^2).  General Appearance: Well Groomed  Eye Contact::  Good  Speech:  Normal Rate409  Volume:  Normal  Mood:  improved   Affect:  Appropriate  Thought Process:  Linear  Orientation:  Other:  oriented x 3   Thought Content:  linear   Suicidal Thoughts:  No denies any suicidal ideations, denies any self injurious ideations  Homicidal Thoughts:  No denies any homicidal ideations   Memory:  recent and remote grossly intact   Judgement:  Other:  improved  Insight:  Present  Psychomotor Activity:  Normal  Concentration:  Good  Recall:  Good  Fund of Knowledge:Good  Language: Good  Akathisia:  Negative  Handed:  Right  AIMS (if indicated):     Assets:  Communication Skills Desire for Improvement Resilience  Sleep:  Number of Hours: 6.5  Cognition: WNL  ADL's:  Intact   Have you used any form of tobacco in the last 30 days? (Cigarettes, Smokeless Tobacco, Cigars, and/or Pipes): Yes  Has this patient used any form of tobacco in the last 30 days? (Cigarettes, Smokeless Tobacco, Cigars, and/or Pipes) Yes, A prescription for an FDA-approved tobacco cessation medication was offered at discharge and the patient refused  Mental Status Per Nursing Assessment::   On Admission:     Current Mental Status by Physician: At this time patient is partially improved compared to admission- he reports improving mood, improved range of affect , no thought disorder, no suicidal ideations , no homicidal ideations No  hallucinations, no delusions   Loss Factors: Relationship stressors, currently unemployed   Historical Factors: No prior psychiatric admissions, history of depression, history of anxiety, history of alcohol abuse   Risk Reduction Factors:   Sense of responsibility to family, Living with another person, especially a relative, Positive social support and Positive coping skills or problem solving skills  Continued Clinical Symptoms:  Improved compared to admission- see above   Cognitive Features That Contribute To Risk:  No gross cognitive deficits noted upon discharge. Is alert , attentive, and oriented x 3    Suicide Risk:  Mild:  Suicidal ideation of limited frequency, intensity, duration, and specificity.  There are no identifiable plans, no associated intent, mild dysphoria and related symptoms, good self-control (both objective and subjective assessment), few other risk factors, and identifiable protective factors, including available and accessible social support.  Principal Problem: MDD (major depressive disorder), recurrent severe, without psychosis [facility](HCC) Discharge Diagnoses:  Patient Active Problem List   Diagnosis Date Noted  . MDD (major depressive disorder), recurrent severe, without psychosis (HCC) [F33.2] 07/06/2015  . Alcohol use disorder, severe, dependence (HCC) [F10.20] 07/03/2015    Follow-up Information    [facility] On 07/15/2015.   Why:  at 1:00pm for therapy.   Contact information:   [facility] [address]  [address]  [place], [place] [address] PHONE: [phone] FAX: [phone]      [facility]   Specialty:  Behavioral Health  Why:  Your first appointment will be a walk-in appointment; go between 8am-3pm Monday-Friday within 7 days of your discharge for meds. It is recommended that you go early to avoid long wait times. Please inform staff that you are there for a hospital  discharge   Contact information:   [address] [place] [address] [phone]       Plan Of Care/Follow-up recommendations:  Activity:  as tolerated  Diet:  regular  Tests:  NA Other:  see below   Is patient on multiple antipsychotic therapies at discharge:  No   Has Patient had three or more failed trials of antipsychotic monotherapy by history:  No  Recommended Plan for Multiple Antipsychotic Therapies: NA  Patient is leaving in improved spirits. Plans to return home - lives with mother. Plans to follow up as above    COBOS, FERNANDO 07/13/2015, 12:12 PM

## 2015-07-13 NOTE — Discharge Summary (Signed)
Physician Discharge Summary Note  Patient:  Darrell [name] is an 24 y.o., male MRN:  [id number] DOB:  1991/02/24 Patient phone:  [phone] (home)  Patient address:   [address] [place] [address],  Total Time spent with patient: 30 minutes  Date of Admission:  07/03/2015 Date of Discharge: 07/13/2015  Reason for Admission: PERH&P 24 year old man, states he recently had an argument with his GF , and states " I don't really remember much of it ". States that he felt increasingly upset and angry and states " I ended up smashing my face against the wall a few times ". ( Patient has some bruises and lacerations on face ) . Patient states he also had some passive suicidal ideations, wishing he were dead . He later told his mother, who was concerned about this event, and was brought to the hospital. Patient attributes his agitation and explosiveness to insomnia , and states he had not slept well for A few days due to depression and anxiety.Patient describes a history of chronic depression, and states he has felt depressed for several years .Patient also describes a history of alcohol abuse and states he has been drinking 4-5 times per week, at times to gross intoxication. Patient states, however, that his alcohol consumption pattern has decreased and that he is drinking much less than before- he states alcohol " is a way for me to self medicate" to manage anxiety and depression.  Principal Problem: MDD (major depressive disorder), recurrent severe, without psychosis [facility]) Discharge Diagnoses: Patient Active Problem List   Diagnosis Date Noted  . MDD (major depressive disorder), recurrent severe, without psychosis (HCC) [F33.2] 07/06/2015  . Alcohol use disorder, severe, dependence (HCC) [F10.20] 07/03/2015    Past Psychiatric History: MDD, ETOH Dependence  Past Medical History:  Past Medical History  Diagnosis Date  . Depression    History reviewed. No pertinent past surgical  history. Family History: History reviewed. No pertinent family history. Family Psychiatric  History: SEE CHART Social History:  History  Alcohol Use  . Yes    Comment: Drinks 2-5 days a week     History  Drug Use  . Yes  . Special: Marijuana    Comment: Uses "off and on"    Social History   Social History  . Marital Status: Single    Spouse Name: N/A  . Number of Children: N/A  . Years of Education: N/A   Social History Main Topics  . Smoking status: Current Every Day Smoker -- 1.00 packs/day    Types: Cigarettes  . Smokeless tobacco: None  . Alcohol Use: Yes     Comment: Drinks 2-5 days a week  . Drug Use: Yes    Special: Marijuana     Comment: Uses "off and on"  . Sexual Activity: Not Asked   Other Topics Concern  . None   Social History Narrative    Hospital Course:  Darrell [name] was admitted for MDD (major depressive disorder), recurrent severe, without psychosis (HCC) and crisis management. He was treated discharged with the medications listed below under Medication List.  Medical problems were identified and treated as needed.  Home medications were restarted as appropriate.  Improvement was monitored by observation and Rosalyn Gess Santucci daily report of symptom reduction.  Emotional and mental status was monitored by daily self-inventory reports completed by Rosalyn Gess Townley and clinical staff.         Sargent Mankey Fiebig was evaluated by the  treatment team for stability and plans for continued recovery upon discharge.  Rosalyn GessMichael E Amick motivation was an integral factor for scheduling further treatment.  Employment, transportation, bed availability, health status, family support, and any pending legal issues were also considered during his hospital stay. He was offered further treatment options upon discharge including but not limited to Residential, Intensive Outpatient, and Outpatient treatment.  Rosalyn GessMichael E Simien will follow up with the services as listed below  under Follow Up Information.     Upon completion of this admission the patient was both mentally and medically stable for discharge denying suicidal/homicidal ideation, auditory/visual/tactile hallucinations, delusional thoughts and paranoia.      Physical Findings: AIMS: Facial and Oral Movements Muscles of Facial Expression: None, normal Lips and Perioral Area: None, normal Jaw: None, normal Tongue: None, normal,Extremity Movements Upper (arms, wrists, hands, fingers): None, normal Lower (legs, knees, ankles, toes): None, normal, Trunk Movements Neck, shoulders, hips: None, normal, Overall Severity Severity of abnormal movements (highest score from questions above): None, normal Incapacitation due to abnormal movements: None, normal Patient's awareness of abnormal movements (rate only patient's report): No Awareness, Dental Status Current problems with teeth and/or dentures?: No Does patient usually wear dentures?: No  CIWA:  CIWA-Ar Total: 1 COWS:  COWS Total Score: 1  Musculoskeletal: Strength & Muscle Tone: within normal limits Gait & Station: normal Patient leans: N/A  Psychiatric Specialty Exam: SEE SRA BY MD Review of Systems  HENT: Negative.   Eyes: Negative.   Respiratory: Negative.   Gastrointestinal: Negative.   Genitourinary: Negative.   Musculoskeletal: Negative.   Skin: Negative.   Neurological: Negative.   Endo/Heme/Allergies: Negative.   Psychiatric/Behavioral: Negative for suicidal ideas. Depression: stable. Substance abuse: stable. Nervous/anxious: stable.   All other systems reviewed and are negative.   Blood pressure 121/72, pulse 78, temperature 98.5 F (36.9 C), temperature source Oral, resp. rate 20, height 6\' 2"  (1.88 m), weight 102.513 kg (226 lb), SpO2 100 %.Body mass index is 29 kg/(m^2).  Have you used any form of tobacco in the last 30 days? (Cigarettes, Smokeless Tobacco, Cigars, and/or Pipes): Yes  Has this patient used any form of tobacco in  the last 30 days? (Cigarettes, Smokeless Tobacco, Cigars, and/or Pipes) Yes, Yes, A prescription for an FDA-approved tobacco cessation medication was offered at discharge and the patient refused  Metabolic Disorder Labs:  No results found for: HGBA1C, MPG No results found for: PROLACTIN No results found for: CHOL, TRIG, HDL, CHOLHDL, VLDL, LDLCALC  See Psychiatric Specialty Exam and Suicide Risk Assessment completed by Attending Physician prior to discharge.  Discharge destination:  Home  Is patient on multiple antipsychotic therapies at discharge:  No   Has Patient had three or more failed trials of antipsychotic monotherapy by history:  No  Recommended Plan for Multiple Antipsychotic Therapies: NA  Discharge Instructions    Activity as tolerated - No restrictions    Complete by:  As directed      Diet general    Complete by:  As directed      Discharge instructions    Complete by:  As directed   Patient has been instructed to take medications as prescribed; and report adverse effects to outpatient provider.  Follow up with primary doctor for any medical issues and If symptoms recur report to nearest emergency or crisis hot line.            Medication List    TAKE these medications      Indication  acamprosate 333 MG tablet  Commonly known as:  CAMPRAL  Take 2 tablets (666 mg total) by mouth 3 (three) times daily with meals.      ARIPiprazole 2 MG tablet  Commonly known as:  ABILIFY  Take 1 tablet (2 mg total) by mouth daily.   Indication:  mood stabilization     FLUoxetine 10 MG capsule  Commonly known as:  PROZAC  Take 3 capsules (30 mg total) by mouth daily.   Indication:  mood stabilization     traZODone 50 MG tablet  Commonly known as:  DESYREL  Take 1 tablet (50 mg total) by mouth at bedtime as needed for sleep.   Indication:  Trouble Sleeping           Follow-up Information    [facility] with [facility] On 07/15/2015.   Why:   at 1:00pm for therapy.   Contact information:   [facility] [address]  [address]  [place], [place] [address] PHONE: [phone] FAX: [phone]      Follow up with [facility].   Specialty:  Behavioral Health   Why:  Your first appointment will be a walk-in appointment; go between 8am-3pm Monday-Friday within 7 days of your discharge for meds. It is recommended that you go early to avoid long wait times. Please inform staff that you are there for a hospital discharge   Contact information:   [address] [place] [address] [phone]       Follow-up recommendations:  Activity:  as tolerated Diet:  heart healthy  Comments: Take all of you medications as prescribed by your mental healthcare provider.  Report any adverse effects and reactions from your medications to your outpatient provider promptly. Do not engage in alcohol and or illegal drug use while on prescription medicines. In the event of worsening symptoms call the crisis hotline, 911, and or go to the nearest emergency department for appropriate evaluation and treatment of symptoms. Follow-up with your primary care provider for your medical issues, concerns and or health care needs.   Keep all scheduled appointments.  If you are unable to keep an appointment call to reschedule.  Let the nurse know if you will need medications before next scheduled appointment.  Signed: [name] FNP- [facility] 07/13/2015, 9:05 AM   Patient seen, Suicide Assessment Completed.  Disposition Plan Reviewed

## 2015-07-13 NOTE — Progress Notes (Signed)
D: Pt is alert and oriented x4. Pt complained of moderate anxiety of 6 on a 0-10 anxiety scale. He states, "I am really working on my coping skills, I guess I still have ways to go; my anxiety is at a 6 right now." Pt however denies depression, pain, SI, HI and AVH. Pt remained calm and cooperative through the shift assessment.  A: Medications offered as prescribed.  Support, encouragement, and safe environment provided.  15-minute safety checks continue.  R: Pt was med compliant.  Pt did attend group. Safety checks continue.

## 2015-07-13 NOTE — Progress Notes (Signed)
Discharge note: Pt received both written and verbal discharge instructions. Pt  Verbalized understanding of discharge instructions. Pt agreed to f/u appt and med regimen. Pt received sample meds, prescriptions and belongings. Pt given a bus pass to transport home via [place]. Pt safely left [facility].

## 2016-12-19 ENCOUNTER — Emergency Department (HOSPITAL_COMMUNITY): Payer: Self-pay

## 2016-12-19 ENCOUNTER — Encounter (HOSPITAL_COMMUNITY): Payer: Self-pay | Admitting: Emergency Medicine

## 2016-12-19 ENCOUNTER — Emergency Department (HOSPITAL_COMMUNITY)
Admission: EM | Admit: 2016-12-19 | Discharge: 2016-12-19 | Disposition: A | Discharge status: Home / Self Care | Payer: Self-pay | Attending: Emergency Medicine | Admitting: Emergency Medicine

## 2016-12-19 DIAGNOSIS — F17210 Nicotine dependence, cigarettes, uncomplicated: Secondary | ICD-10-CM | POA: Insufficient documentation

## 2016-12-19 DIAGNOSIS — Y9289 Other specified places as the place of occurrence of the external cause: Secondary | ICD-10-CM | POA: Insufficient documentation

## 2016-12-19 DIAGNOSIS — W228XXA Striking against or struck by other objects, initial encounter: Secondary | ICD-10-CM | POA: Insufficient documentation

## 2016-12-19 DIAGNOSIS — Y939 Activity, unspecified: Secondary | ICD-10-CM | POA: Insufficient documentation

## 2016-12-19 DIAGNOSIS — Z79899 Other long term (current) drug therapy: Secondary | ICD-10-CM | POA: Insufficient documentation

## 2016-12-19 DIAGNOSIS — S61216A Laceration without foreign body of right little finger without damage to nail, initial encounter: Secondary | ICD-10-CM | POA: Insufficient documentation

## 2016-12-19 DIAGNOSIS — Y999 Unspecified external cause status: Secondary | ICD-10-CM | POA: Insufficient documentation

## 2016-12-19 MED ORDER — BACITRACIN ZINC 500 UNIT/GM EX OINT
TOPICAL_OINTMENT | Freq: Two times a day (BID) | CUTANEOUS | Status: DC
  Administered 2016-12-19: 14:00:00 via TOPICAL
  Filled 2016-12-19: qty 0.9

## 2016-12-19 MED ORDER — LIDOCAINE HCL 1 % IJ SOLN
INTRAMUSCULAR | Status: AC
  Administered 2016-12-19: 14:00:00
  Filled 2016-12-19: qty 20

## 2016-12-19 MED ORDER — TETANUS-DIPHTH-ACELL PERTUSSIS 5-2.5-18.5 LF-MCG/0.5 IM SUSP
0.5000 mL | Freq: Once | INTRAMUSCULAR | Status: AC
  Administered 2016-12-19: 0.5 mL via INTRAMUSCULAR
  Filled 2016-12-19: qty 0.5

## 2016-12-19 NOTE — Discharge Instructions (Signed)
Return to have stitches removed in 7-10 days. Follow wound care as instructed. Return to ED for worsening pain, numbness, tingling, increased bleeding, additional injury.
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
## 2016-12-19 NOTE — ED Notes (Signed)
Pt unable to esign d/t being handcuffed. Pt and deputies verbalized understanding of d/c instructions

## 2016-12-19 NOTE — ED Notes (Signed)
Bed: ZO10 Expected date:  Expected time:  Means of arrival:  Comments: Inmate
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
## 2020-11-19 ENCOUNTER — Other Ambulatory Visit: Payer: Self-pay

## 2020-11-19 ENCOUNTER — Emergency Department (HOSPITAL_BASED_OUTPATIENT_CLINIC_OR_DEPARTMENT_OTHER): Payer: Medicaid Other

## 2020-11-19 ENCOUNTER — Emergency Department (HOSPITAL_BASED_OUTPATIENT_CLINIC_OR_DEPARTMENT_OTHER)
Admission: EM | Admit: 2020-11-19 | Discharge: 2020-11-19 | Disposition: A | Discharge status: Home / Self Care | Payer: Medicaid Other | Attending: Emergency Medicine | Admitting: Emergency Medicine

## 2020-11-19 ENCOUNTER — Encounter (HOSPITAL_BASED_OUTPATIENT_CLINIC_OR_DEPARTMENT_OTHER): Payer: Self-pay | Admitting: Emergency Medicine

## 2020-11-19 DIAGNOSIS — S60947A Unspecified superficial injury of left little finger, initial encounter: Secondary | ICD-10-CM | POA: Diagnosis present

## 2020-11-19 DIAGNOSIS — W230XXA Caught, crushed, jammed, or pinched between moving objects, initial encounter: Secondary | ICD-10-CM | POA: Diagnosis not present

## 2020-11-19 DIAGNOSIS — F17210 Nicotine dependence, cigarettes, uncomplicated: Secondary | ICD-10-CM | POA: Insufficient documentation

## 2020-11-19 DIAGNOSIS — S61217A Laceration without foreign body of left little finger without damage to nail, initial encounter: Secondary | ICD-10-CM | POA: Insufficient documentation

## 2020-11-19 MED ORDER — LIDOCAINE HCL (PF) 1 % IJ SOLN
2.0000 mL | Freq: Once | INTRAMUSCULAR | Status: AC
  Administered 2020-11-19: 2 mL
  Filled 2020-11-19: qty 5

## 2020-11-19 NOTE — ED Notes (Signed)
Patient requested to have Order for XRAY discontinued at this time.
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
## 2020-11-19 NOTE — ED Provider Notes (Signed)
[facility] EMERGENCY DEPT Provider Note   CSN: [id number] Arrival date & time: 11/19/20  1910     History Chief Complaint  Patient presents with  . Finger Injury    Darrell [name] is a 30 y.o. male.  The history is provided by the patient.  Laceration Location:  Finger Finger laceration location:  L little finger Length:  3 cm Depth:  Cutaneous Quality: jagged   Bleeding: controlled   Laceration mechanism:  Metal edge and blunt object Pain details:    Quality:  Throbbing   Severity:  Mild   Timing:  Constant   Progression:  Unchanged Foreign body present:  No foreign bodies Relieved by:  Nothing Worsened by:  Nothing Ineffective treatments:  None tried Tetanus status:  Up to date Associated symptoms: numbness (right around the wound)   Associated symptoms: no fever, no focal weakness, no rash and no redness        Past Medical History:  Diagnosis Date  . Depression     Patient Active Problem List   Diagnosis Date Noted  . Severe episode of recurrent major depressive disorder, without psychotic features (HCC)   . MDD (major depressive disorder), recurrent severe, without psychosis (HCC) 07/06/2015  . Alcohol use disorder, severe, dependence (HCC) 07/03/2015    History reviewed. No pertinent surgical history.     No family history on file.  Social History   Tobacco Use  . Smoking status: Current Every Day Smoker    Packs/day: 1.00    Types: Cigarettes  . Smokeless tobacco: Never Used  Substance Use Topics  . Alcohol use: Yes    Comment: Drinks 2-5 days a week  . Drug use: Yes    Types: Marijuana    Comment: Uses "off and on"    Home Medications Prior to Admission medications   Medication Sig Start Date End Date Taking? Authorizing Provider  acamprosate (CAMPRAL) 333 MG tablet Take 2 tablets (666 mg total) by mouth 3 (three) times daily with meals. 07/13/15   [name], NP  ARIPiprazole (ABILIFY) 2 MG tablet Take 1  tablet (2 mg total) by mouth daily. 07/13/15   [name], NP  FLUoxetine (PROZAC) 10 MG capsule Take 3 capsules (30 mg total) by mouth daily. 07/13/15   [name], NP  nicotine (NICODERM CQ - DOSED IN MG/24 HOURS) 21 mg/24hr patch Place 1 patch (21 mg total) onto the skin daily. 07/13/15   [name], NP  traZODone (DESYREL) 50 MG tablet Take 1 tablet (50 mg total) by mouth at bedtime as needed for sleep. 07/13/15   [name], NP    Allergies    Patient has no known allergies.  Review of Systems   Review of Systems  Constitutional: Negative for chills and fever.  HENT: Negative for ear pain and sore throat.   Eyes: Negative for pain and visual disturbance.  Respiratory: Negative for cough and shortness of breath.   Cardiovascular: Negative for chest pain and palpitations.  Gastrointestinal: Negative for abdominal pain and vomiting.  Genitourinary: Negative for dysuria and hematuria.  Musculoskeletal: Negative for arthralgias and back pain.  Skin: Positive for wound. Negative for color change and rash.  Neurological: Negative for focal weakness, seizures and syncope.  All other systems reviewed and are negative.   Physical Exam Updated Vital Signs BP 125/84 (BP Location: Right Arm)   Pulse 73   Temp 98.3 F (36.8 C) (Oral)   Resp 18   Ht  6\' 3"  (1.905 m)   Wt 94.3 kg   SpO2 100%   BMI 26.00 kg/m   Physical Exam Vitals and nursing note reviewed.  Constitutional:      Appearance: Normal appearance.  HENT:     Head: Normocephalic and atraumatic.  Eyes:     Conjunctiva/sclera: Conjunctivae normal.  Pulmonary:     Effort: Pulmonary effort is normal. No respiratory distress.  Musculoskeletal:        General: No deformity. Normal range of motion.     Cervical back: Normal range of motion.  Skin:    General: Skin is warm and dry.     Comments: There was a jagged laceration on the lateral aspect of the left fifth finger.  It started just at the very  distal aspect of the fifth MCP and extended past the PIP.  There is a little bit of focal numbness at the very proximal aspect of the wound.  No tendon dysfunction.  Neurological:     General: No focal deficit present.     Mental Status: He is alert and oriented to person, place, and time. Mental status is at baseline.  Psychiatric:        Mood and Affect: Mood normal.     ED Results / Procedures / Treatments   Labs (all labs ordered are listed, but only abnormal results are displayed) Labs Reviewed - No data to display  EKG None  Radiology No results found.  Procedures . Laceration Repair  Date/Time: 11/19/2020 10:18 PM Performed by: 11/21/2020, MD Authorized by: Koleen Distance, MD   Consent:    Consent obtained:  Verbal   Consent given by:  Patient   Risks, benefits, and alternatives were discussed: yes     Risks discussed:  Infection, need for additional repair, nerve damage, pain, poor cosmetic result, poor wound healing, retained foreign body, tendon damage and vascular damage   Alternatives discussed:  No treatment, delayed treatment and observation Universal protocol:    Immediately prior to procedure, a time out was called: yes     Patient identity confirmed:  Verbally with patient Anesthesia:    Anesthesia method:  Local infiltration   Local anesthetic:  Lidocaine 1% w/o epi Laceration details:    Location:  Finger   Length (cm):  3   Depth (mm):  2 Pre-procedure details:    Preparation:  Patient was prepped and draped in usual sterile fashion Exploration:    Limited defect created (wound extended): no     Hemostasis achieved with:  Direct pressure   Wound exploration: wound explored through full range of motion and entire depth of wound visualized     Wound extent: no foreign bodies/material noted, no muscle damage noted, no nerve damage noted, no tendon damage noted, no underlying fracture noted and no vascular damage noted     Contaminated: no    Treatment:    Area cleansed with:  Povidone-iodine   Amount of cleaning:  Standard   Irrigation solution:  Sterile saline   Irrigation volume:  20 cc   Irrigation method:  Syringe   Visualized foreign bodies/material removed: no     Debridement:  None   Undermining:  None   Scar revision: no   Skin repair:    Repair method:  Sutures   Suture size:  4-0   Suture material:  Prolene   Suture technique:  Simple interrupted   Number of sutures:  5 Approximation:    Approximation:  Close Repair  type:    Repair type:  Simple Post-procedure details:    Dressing:  Non-adherent dressing and splint for protection   Procedure completion:  Tolerated well, no immediate complications     Medications Ordered in ED Medications  lidocaine (PF) (XYLOCAINE) 1 % injection 2 mL (2 mLs Infiltration Given by Other 11/19/20 2131)    ED Course  I have reviewed the triage vital signs and the nursing notes.  Pertinent labs & imaging results that were available during my care of the patient were reviewed by me and considered in my medical decision making (see chart for details).    MDM Rules/Calculators/A&P                          Coleson Kant Jeff presents with laceration to his little finger.  No evidence of deep injury.  He does have some very minor focal numbness at one area of the wound, and it is possible that he lacerated a tiny peripheral nerve.  However, I do not see any obvious nerve damage upon inspection.  The wound was repaired, and he was given careful instructions on taking care of it. Final Clinical Impression(s) / ED Diagnoses Final diagnoses:  Laceration of left little finger without foreign body without damage to nail, initial encounter    Rx / DC Orders ED Discharge Orders    None       Koleen Distance, MD 11/19/20 2220

## 2020-11-19 NOTE — Discharge Instructions (Addendum)
Your stitches need to be removed in 10 days.

## 2021-05-26 ENCOUNTER — Other Ambulatory Visit: Payer: Self-pay

## 2021-05-26 ENCOUNTER — Ambulatory Visit (HOSPITAL_COMMUNITY)
Admission: EM | Admit: 2021-05-26 | Discharge: 2021-05-26 | Disposition: A | Discharge status: Home / Self Care | Payer: Medicaid Other | Attending: Psychiatry | Admitting: Psychiatry

## 2021-05-26 DIAGNOSIS — F419 Anxiety disorder, unspecified: Secondary | ICD-10-CM | POA: Insufficient documentation

## 2021-05-26 DIAGNOSIS — F331 Major depressive disorder, recurrent, moderate: Secondary | ICD-10-CM | POA: Insufficient documentation

## 2021-05-26 MED ORDER — HYDROXYZINE PAMOATE 25 MG PO CAPS: 25.0000 mg | ORAL_CAPSULE | Freq: Three times a day (TID) | ORAL | 0 refills | Status: DC | PRN

## 2021-05-26 NOTE — Discharge Instructions (Addendum)

## 2021-05-26 NOTE — ED Provider Notes (Signed)
Behavioral Health Urgent Care Medical Screening Exam  Patient Name: Darrell [name] MRN: [id number] Date of Evaluation: 05/26/21 Chief Complaint:   Diagnosis:  Final diagnoses:  MDD (major depressive disorder), recurrent episode, moderate (HCC)    History of Present illness: Darrell [name] is a 30 y.o. male patient presented to [facility] as a walk in alone with complaints of " been having intrusive thoughts of feeling like I am worthless".  Darrell [name], 30 y.o., male patient seen face to face by this provider, consulted with Dr. Lucianne Muss; and chart reviewed on 05/26/21.  On evaluation Darrell [name] reports for the past three weeks his feelings of worthlessness, irritability, and anxiety has worsened.  States his only trigger is work, states it has been stressful.  Reports he stopped smoking marijuana 2 weeks ago and denies any alcohol or other substance use.  He currently does not take any medications.  Has had one inpatient psychiatric admission in 2016.  Currently does not have an outpatient psychiatric provider or therapist.  Patient is requesting that he be restarted on medications for depression and anxiety.  During evaluation Darrell [name] is sitting position in no acute distress.  He is fairly groomed and makes good eye contact.  Speech is clear, coherent, normal rate and tone.  He s alert/oriented x 4; and cooperative.  Endorses anxiety and depression with a congruent affect.  Endorses feelings of worthlessness and "feeling down all the time".  He sleeps 6 hours per night.  Denies any concerns with appetite.  His thought process is coherent and relevant; There is no indication that he is currently responding to internal/external stimuli or experiencing delusional thought content.denies auditory and visual hallucinations.  Denies psychosis and paranoia.  Denies suicidal/homicidal ideations.  Patient contracts for safety.  Denies access to firearms/weapons. Patient answered questions  appropriately.    Patient currently lives with his girlfriend and 2 children.  Explained  [facility] behavioral health outpatient services on the second floor including open access walk-in hours for medication management and therapy.  Patient agrees to do a walk-in appointment in the morning.  Patient was provided education and a printed prescription for hydroxyzine 25 mg p.o. 3 times daily as needed for anxiety.   At this time Darrell [name] is educated and verbalizes understanding of mental health resources and other crisis services in the community.  He is instructed to call 911 and present to the nearest emergency room should he experience any suicidal/homicidal ideation, auditory/visual/hallucinations, or detrimental worsening of his mental health condition.  He was a also advised by Clinical research associate that he could call the toll-free phone on insurance card to assist with identifying in network counselors and agencies or number on back of Medicaid card to speak with care coordinator    Psychiatric Specialty Exam  Presentation  General Appearance:Appropriate for Environment  Eye Contact:Good  Speech:Clear and Coherent; Normal Rate  Speech Volume:Normal  Handedness:Right   Mood and Affect  Mood:Anxious; Depressed  Affect:Congruent   Thought Process  Thought Processes:Coherent  Descriptions of Associations:Intact  Orientation:Full (Time, Place and Person)  Thought Content:Logical    Hallucinations:None  Ideas of Reference:None  Suicidal Thoughts:No  Homicidal Thoughts:No   Sensorium  Memory:Immediate Good; Recent Good; Remote Good  Judgment:Good  Insight:Good   Executive Functions  Concentration:Good  Attention Span:Good  Recall:Good  Fund of Knowledge:Good  Language:Good   Psychomotor Activity  Psychomotor Activity:Normal   Assets  Assets:Communication Skills; Desire for Improvement; Financial Resources/Insurance; Housing; Physical Health;  Resilience;  Social Support; Vocational/Educational   Sleep  Sleep:Fair  Number of hours: 6   No data recorded  Physical Exam: Physical Exam Vitals and nursing note reviewed.  Constitutional:      Appearance: Normal appearance. He is well-developed.  HENT:     Head: Normocephalic and atraumatic.     Right Ear: External ear normal.     Left Ear: External ear normal.  Eyes:     General:        Right eye: No discharge.        Left eye: No discharge.     Conjunctiva/sclera: Conjunctivae normal.  Cardiovascular:     Rate and Rhythm: Normal rate and regular rhythm.     Heart sounds: No murmur heard. Pulmonary:     Effort: Pulmonary effort is normal. No respiratory distress.     Breath sounds: Normal breath sounds.  Abdominal:     Palpations: Abdomen is soft.     Tenderness: There is no abdominal tenderness.  Musculoskeletal:        General: Normal range of motion.     Cervical back: Normal range of motion and neck supple.  Skin:    General: Skin is warm and dry.     Coloration: Skin is not jaundiced or pale.  Neurological:     Mental Status: He is alert and oriented to person, place, and time.  Psychiatric:        Attention and Perception: Attention and perception normal.        Mood and Affect: Mood is anxious and depressed.        Speech: Speech normal.        Behavior: Behavior is cooperative.        Thought Content: Thought content normal.        Cognition and Memory: Cognition normal.        Judgment: Judgment normal.   Review of Systems  Constitutional: Negative.   HENT: Negative.    Eyes: Negative.   Respiratory: Negative.    Cardiovascular: Negative.   Gastrointestinal: Negative.   Genitourinary: Negative.   Musculoskeletal: Negative.   Skin: Negative.   Neurological: Negative.   Endo/Heme/Allergies: Negative.   Psychiatric/Behavioral:  Positive for depression. The patient is nervous/anxious.   Blood pressure 139/80, pulse 78, temperature 97.8 F (36.6  C), temperature source Oral, resp. rate 18, height 6\' 5"  (1.956 m), weight 211 lb (95.7 kg), SpO2 100 %. Body mass index is 25.02 kg/m.  Musculoskeletal: Strength & Muscle Tone: within normal limits Gait & Station: normal Patient leans: N/A   [facility] MSE Discharge Disposition for Follow up and Recommendations: Based on my evaluation the patient does not appear to have an emergency medical condition and can be discharged with resources and follow up care in outpatient services for Medication Management and Individual Therapy  Discharge patient  Explained [facility] behavioral health outpatient services on the second floor including open access walk-in hours.  Patient agrees to do a walk-in appointment in the morning.  Patient was provided education and a printed prescription for hydroxyzine 25 mg p.o. 3 times daily as needed for anxiety.  No evidence of imminent risk to self or others at present.    Patient does not meet criteria for psychiatric inpatient admission. Discussed crisis plan, support from social network, calling 911, coming to the Emergency Department, and calling Suicide Hotline.    [facility], NP 05/26/2021, 10:05 AM

## 2021-05-26 NOTE — Progress Notes (Signed)
   05/26/21 0906  [facility] Triage Screening (Walk-ins at [facility] only)  How Did You Hear About [name]? Self  What Is the Reason for Your Visit/Call Today? 30 year old Clemente Radziewicz present to [facility] with complaints of negative intrusive thoughts, passive suicidal thoughts (questioning his worth) and anger issues. Report negative feelings started 3-weeks ago triggered by stress at work. Denied hx of suicidal intent, denied homicidal ideations, and denied auditory/visual hallucinations. Report intrusive thoughts/anger issues affecting his relationship with girlfriend. Currently not on medication. Stopped smoking THC 2-weeks ago, 1 1/2 sober from alcohol.  How Long Has This Been Causing You Problems? 1 wk - 1 month  Have You Recently Had Any Thoughts About Hurting Yourself? No  Are You Planning to Commit Suicide/Harm Yourself At This time? No  Have you Recently Had Thoughts About Hurting Someone Karolee Ohs? No  Are You Planning To Harm Someone At This Time? No  Are you currently experiencing any auditory, visual or other hallucinations? No  Do you have any current medical co-morbidities that require immediate attention? No  Clinician description of patient physical appearance/behavior: Patient stressed appropriately for the weather, attitude cooperative and pleasant.  What Do You Feel Would Help You the Most Today? Stress Management;Medication(s)  If access to [facility] was not available, would you have sought care in the Emergency Department? Yes  Determination of Need Routine (7 days)  Options For Referral Medication Management;Outpatient Therapy

## 2021-05-27 ENCOUNTER — Ambulatory Visit (INDEPENDENT_AMBULATORY_CARE_PROVIDER_SITE_OTHER): Payer: Medicaid Other | Admitting: Licensed Clinical Social Worker

## 2021-05-27 DIAGNOSIS — F331 Major depressive disorder, recurrent, moderate: Secondary | ICD-10-CM | POA: Diagnosis not present
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
## 2021-06-17 ENCOUNTER — Emergency Department (HOSPITAL_BASED_OUTPATIENT_CLINIC_OR_DEPARTMENT_OTHER): Payer: Medicaid Other | Admitting: Radiology

## 2021-06-17 ENCOUNTER — Emergency Department (HOSPITAL_BASED_OUTPATIENT_CLINIC_OR_DEPARTMENT_OTHER)
Admission: EM | Admit: 2021-06-17 | Discharge: 2021-06-17 | Disposition: A | Discharge status: Home / Self Care | Payer: Medicaid Other | Attending: Emergency Medicine | Admitting: Emergency Medicine

## 2021-06-17 ENCOUNTER — Other Ambulatory Visit: Payer: Self-pay

## 2021-06-17 ENCOUNTER — Encounter (HOSPITAL_BASED_OUTPATIENT_CLINIC_OR_DEPARTMENT_OTHER): Payer: Self-pay

## 2021-06-17 DIAGNOSIS — Z23 Encounter for immunization: Secondary | ICD-10-CM | POA: Insufficient documentation

## 2021-06-17 DIAGNOSIS — S6990XA Unspecified injury of unspecified wrist, hand and finger(s), initial encounter: Secondary | ICD-10-CM | POA: Diagnosis present

## 2021-06-17 DIAGNOSIS — S62630B Displaced fracture of distal phalanx of right index finger, initial encounter for open fracture: Secondary | ICD-10-CM | POA: Diagnosis not present

## 2021-06-17 DIAGNOSIS — Y9389 Activity, other specified: Secondary | ICD-10-CM | POA: Insufficient documentation

## 2021-06-17 DIAGNOSIS — F17210 Nicotine dependence, cigarettes, uncomplicated: Secondary | ICD-10-CM | POA: Insufficient documentation

## 2021-06-17 DIAGNOSIS — Y9289 Other specified places as the place of occurrence of the external cause: Secondary | ICD-10-CM | POA: Diagnosis not present

## 2021-06-17 DIAGNOSIS — W278XXA Contact with other nonpowered hand tool, initial encounter: Secondary | ICD-10-CM | POA: Diagnosis not present

## 2021-06-17 MED ORDER — OXYCODONE HCL 5 MG PO TABS: 5.0000 mg | ORAL_TABLET | Freq: Four times a day (QID) | ORAL | 0 refills | Status: AC | PRN

## 2021-06-17 MED ORDER — CEPHALEXIN 250 MG PO CAPS
500.0000 mg | ORAL_CAPSULE | Freq: Once | ORAL | Status: AC
  Administered 2021-06-17: 500 mg via ORAL
  Filled 2021-06-17: qty 2

## 2021-06-17 MED ORDER — CEPHALEXIN 500 MG PO CAPS: 500.0000 mg | ORAL_CAPSULE | Freq: Four times a day (QID) | ORAL | 0 refills | Status: DC

## 2021-06-17 MED ORDER — HYDROCODONE-ACETAMINOPHEN 5-325 MG PO TABS
1.0000 | ORAL_TABLET | Freq: Once | ORAL | Status: AC
  Administered 2021-06-17: 1 via ORAL
  Filled 2021-06-17: qty 1

## 2021-06-17 MED ORDER — TETANUS-DIPHTH-ACELL PERTUSSIS 5-2.5-18.5 LF-MCG/0.5 IM SUSY
0.5000 mL | PREFILLED_SYRINGE | Freq: Once | INTRAMUSCULAR | Status: AC
  Administered 2021-06-17: 0.5 mL via INTRAMUSCULAR
  Filled 2021-06-17: qty 0.5

## 2021-06-17 NOTE — ED Provider Notes (Signed)
[facility] EMERGENCY DEPT Provider Note   CSN: [id number] Arrival date & time: 06/17/21  0831     History Chief Complaint  Patient presents with   Finger Injury    Darrell [name] is a 30 y.o. male.  HPI Patient presents for finger injury.  Injury occurred yesterday.  At the time he was hitting a metal rod with a sledgehammer.  When he swung the sledgehammer missed the metal rod and he struck his finger against the rod.  He has since had pain in the distal portion of his first index finger.  He has taken Tylenol for pain.  Last dose was last night.  He sustained a laceration over the distal joint.  He is unaware of when his last tetanus shot was.  Patient denies any previous injury to this finger.    Past Medical History:  Diagnosis Date   Depression     Patient Active Problem List   Diagnosis Date Noted   Severe episode of recurrent major depressive disorder, without psychotic features (HCC)    MDD (major depressive disorder), recurrent severe, without psychosis (HCC) 07/06/2015   Alcohol use disorder, severe, dependence (HCC) 07/03/2015    History reviewed. No pertinent surgical history.     History reviewed. No pertinent family history.  Social History   Tobacco Use   Smoking status: Every Day    Packs/day: 1.00    Types: Cigarettes    Passive exposure: Current   Smokeless tobacco: Never  Vaping Use   Vaping Use: Former  Substance Use Topics   Alcohol use: Yes    Comment: Drinks 2-5 days a week   Drug use: Yes    Types: Marijuana    Comment: Uses "off and on"    Home Medications Prior to Admission medications   Medication Sig Start Date End Date Taking? Authorizing Provider  cephALEXin (KEFLEX) 500 MG capsule Take 1 capsule (500 mg total) by mouth 4 (four) times daily. 06/17/21  Yes Gloris Manchester, MD  oxyCODONE (ROXICODONE) 5 MG immediate release tablet Take 1 tablet (5 mg total) by mouth every 6 (six) hours as needed for up to 4 days for  severe pain. 06/17/21 06/21/21 Yes Gloris Manchester, MD  hydrOXYzine (VISTARIL) 25 MG capsule Take 1 capsule (25 mg total) by mouth 3 (three) times daily as needed for anxiety. 05/26/21   Ardis Hughs, NP    Allergies    Patient has no known allergies.  Review of Systems   Review of Systems  Constitutional:  Negative for chills and fever.  HENT:  Negative for ear pain and sore throat.   Eyes:  Negative for pain and visual disturbance.  Respiratory:  Negative for cough and shortness of breath.   Cardiovascular:  Negative for chest pain and palpitations.  Gastrointestinal:  Negative for abdominal pain, nausea and vomiting.  Genitourinary:  Negative for dysuria and hematuria.  Musculoskeletal:  Negative for back pain, myalgias and neck pain.  Skin:  Positive for wound. Negative for color change and rash.  Neurological:  Negative for seizures, syncope and weakness.  Hematological:  Does not bruise/bleed easily.  Psychiatric/Behavioral:  Negative for dysphoric mood.   All other systems reviewed and are negative.  Physical Exam Updated Vital Signs BP (!) 163/90 (BP Location: Right Arm)   Pulse (!) 58   Temp 98 F (36.7 C) (Oral)   Resp 16   Ht 6\' 4"  (1.93 m)   Wt 95.3 kg   SpO2 100%   BMI  25.56 kg/m   Physical Exam Vitals and nursing note reviewed.  Constitutional:      General: He is not in acute distress.    Appearance: Normal appearance. He is well-developed and normal weight. He is not ill-appearing, toxic-appearing or diaphoretic.  HENT:     Head: Normocephalic and atraumatic.     Right Ear: External ear normal.     Left Ear: External ear normal.  Eyes:     Conjunctiva/sclera: Conjunctivae normal.  Cardiovascular:     Rate and Rhythm: Normal rate and regular rhythm.     Heart sounds: No murmur heard. Pulmonary:     Effort: Pulmonary effort is normal. No respiratory distress.     Breath sounds: Normal breath sounds.  Abdominal:     General: Abdomen is flat.      Palpations: Abdomen is soft.  Musculoskeletal:        General: Swelling, tenderness and signs of injury present.     Cervical back: Neck supple.  Skin:    General: Skin is warm and dry.  Neurological:     General: No focal deficit present.     Mental Status: He is alert and oriented to person, place, and time.     Cranial Nerves: No cranial nerve deficit.     Sensory: No sensory deficit.     Motor: No weakness.  Psychiatric:        Mood and Affect: Mood normal.        Behavior: Behavior normal.        Thought Content: Thought content normal.        Judgment: Judgment normal.    ED Results / Procedures / Treatments   Labs (all labs ordered are listed, but only abnormal results are displayed) Labs Reviewed - No data to display  EKG None  Radiology DG Hand Complete Right  Result Date: 06/17/2021 CLINICAL DATA:  Right index finger injury. EXAM: RIGHT HAND - COMPLETE 3+ VIEW COMPARISON:  December 19, 2016. FINDINGS: Moderately displaced and comminuted fracture is seen involving the second distal phalanx. Joint spaces are intact. IMPRESSION: Moderately displaced and comminuted second distal phalangeal fracture. Electronically Signed   By: Lupita Raider M.D.   On: 06/17/2021 09:24    Procedures Procedures   Medications Ordered in ED Medications  HYDROcodone-acetaminophen (NORCO/VICODIN) 5-325 MG per tablet 1 tablet (1 tablet Oral Given 06/17/21 0914)  Tdap (BOOSTRIX) injection 0.5 mL (0.5 mLs Intramuscular Given 06/17/21 0915)  cephALEXin (KEFLEX) capsule 500 mg (500 mg Oral Given 06/17/21 4403)    ED Course  I have reviewed the triage vital signs and the nursing notes.  Pertinent labs & imaging results that were available during my care of the patient were reviewed by me and considered in my medical decision making (see chart for details).    MDM Rules/Calculators/A&P                          Patient presents for injury to his right index finger.  Injury occurred  yesterday morning.  He has since had pain and swelling in that area.  On exam, patient is well-appearing.  Area of injury does have a nearby laceration overlying the dorsal aspect of his DIP.  This wound is hemostatic.  Tetanus was updated.  X-ray imaging confirmed distal phalanx fracture.  Patient will require antibiotics for open fracture prophylaxis.  First dose of Keflex ordered in the ED. I did speak with hand surgeon on-call, Dr.  Coley, who advised splinting as is with close follow-up in clinic.  Patient was given contact information for Dr. Debby Bud office.  Prescriptions for Keflex and Roxicodone were provided.  He was advised to treat pain with ibuprofen and Tylenol with narcotic pain medication to be taken only as needed.  Splint was applied.  Patient was discharged in good condition.  Final Clinical Impression(s) / ED Diagnoses Final diagnoses:  Open displaced fracture of distal phalanx of right index finger, initial encounter    Rx / DC Orders ED Discharge Orders          Ordered    cephALEXin (KEFLEX) 500 MG capsule  4 times daily        06/17/21 0934    oxyCODONE (ROXICODONE) 5 MG immediate release tablet  Every 6 hours PRN        06/17/21 0934             Gloris Manchester, MD 06/17/21 1025

## 2021-06-17 NOTE — ED Triage Notes (Addendum)
2nd digit to Right Hand injury with sledge hammer; occurred yesterday at approx 1030. Small amount bleeding noted, swelling present. Able to move digit.

## 2021-07-31 ENCOUNTER — Ambulatory Visit (INDEPENDENT_AMBULATORY_CARE_PROVIDER_SITE_OTHER): Payer: Medicaid Other | Admitting: Licensed Clinical Social Worker

## 2021-07-31 ENCOUNTER — Other Ambulatory Visit: Payer: Self-pay

## 2021-07-31 DIAGNOSIS — F331 Major depressive disorder, recurrent, moderate: Secondary | ICD-10-CM

## 2021-08-05 ENCOUNTER — Other Ambulatory Visit: Payer: Self-pay

## 2021-08-05 ENCOUNTER — Encounter (HOSPITAL_COMMUNITY): Payer: Self-pay | Admitting: Physician Assistant

## 2021-08-05 ENCOUNTER — Ambulatory Visit (INDEPENDENT_AMBULATORY_CARE_PROVIDER_SITE_OTHER): Payer: Medicaid Other | Admitting: Physician Assistant

## 2021-08-05 VITALS — BP 133/84 | HR 80 | Ht 76.0 in | Wt 221.0 lb

## 2021-08-05 DIAGNOSIS — F3161 Bipolar disorder, current episode mixed, mild: Secondary | ICD-10-CM | POA: Diagnosis not present

## 2021-08-05 DIAGNOSIS — F411 Generalized anxiety disorder: Secondary | ICD-10-CM

## 2021-08-05 MED ORDER — FLUOXETINE HCL 20 MG PO CAPS: 20.0000 mg | ORAL_CAPSULE | Freq: Every day | ORAL | 1 refills | Status: DC

## 2021-08-05 MED ORDER — ARIPIPRAZOLE 5 MG PO TABS: 5.0000 mg | ORAL_TABLET | Freq: Every day | ORAL | 1 refills | Status: DC
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
## 2021-08-08 NOTE — Progress Notes (Signed)
° °  THERAPIST PROGRESS NOTE  Session Time: 45 min  Participation Level: Active  Behavioral Response: CasualAlertDepressed  Type of Therapy: Individual Therapy  Treatment Goals addressed: Communication: dep/coping  Interventions: CBT, Solution Focused, and Supportive  Summary: Darrell [name] is a 30 y.o. male who presents with hx of MDD.  Today patient returns for in person session.  This is first session since initial session 05/27/2021.  Gap in care acknowledged.  Patient has medication management appointment December 13 which he states he is aware of and intends to keep.  LCSW assessed for any significant changes since first session.  Mac reports he feels he is doing slightly better controlling his mood and anger.  Patient reports he is working with a different crew at work which has reduced his stress.  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  Patient reports he has been engaging in exercise by playing basketball.  He admits he feels in a much better mood on the days he exercises and intends to do this as often as possible.  When asked, patient reports he does have faith and spirituality in his life and agrees this is a powerful coping strategy.  LCSW did additional exploration of relationship with his girlfriend, Faith.  He reports he is excluded from family finances and planning.  Per his explanations, it appears they have significant communication barriers.  Sanford feels he has to beg for information and information is often not forthcoming even when he asks. He states she is always "vague" and guarded, particularly with finaces.  There have been no discussions about what will take place for the Christmas holiday or what they will do for the children.  He advises his girlfriend was not raised to celebrate holidays.  Patient states intent to get his dtrs gifts that will be from him.  He will consider a gift that will  make a memory of some sort for his 47-year-old and get something age-appropriate for his 76-year-old.  Skyy admits the way he is treated by his girlfriend contributes to his feelings of worthlessness, does not feel he is her equal but less than.  He states he does not think Faith would engage in couples counseling yet also states he will require this prior to marriage.  LCSW introduced the power of self talk.  Provided literature for patient to take home for additional education. LCSW reviewed poc including scheduling prior to close of session. Pt states appreciation for care.   Suicidal/Homicidal: Nowithout intent/plan  Therapist Response: Pt receptive to care.  Plan: Return again in ~2 weeks.  Diagnosis: Axis I:  MDD, moderate   [place] Sink, LCSW 07/31/21

## 2021-08-14 ENCOUNTER — Other Ambulatory Visit: Payer: Self-pay

## 2021-08-14 ENCOUNTER — Ambulatory Visit (INDEPENDENT_AMBULATORY_CARE_PROVIDER_SITE_OTHER): Payer: Medicaid Other | Admitting: Licensed Clinical Social Worker

## 2021-08-14 ENCOUNTER — Other Ambulatory Visit (HOSPITAL_COMMUNITY): Payer: Self-pay | Admitting: Physician Assistant

## 2021-08-14 DIAGNOSIS — F330 Major depressive disorder, recurrent, mild: Secondary | ICD-10-CM | POA: Diagnosis not present

## 2021-08-14 NOTE — Progress Notes (Signed)
° °  THERAPIST PROGRESS NOTE  Session Time: 45 min  Participation Level: Active  Behavioral Response: CasualAlertEuthymic  Type of Therapy: Individual Therapy  Treatment Goals addressed: Communication: dep/coping  Interventions: Solution Focused and Supportive  Summary: Darrell [name] is a 30 y.o. male who presents with hx of MDD.  Today patient returns for in person session.  Patient reports he is doing "fair and coasting along".  LCSW assessed for outcome of medication management appointment on December 13.  Patient states it went well, he states he has been out of work due to the weather recently and has not had the money to pick up his medications thus far.  Patient reports intent to get them after session today and start taking them as prescribed.  Patient using [place] pharmacy and is advised he would be allowed to charge medications there if needed.  Patient verbalizes understanding.  LCSW assessed for additional information on relationship with girlfriend and mother of his children, Faith.  Patient reports they are doing better.  He states he had a conversation with faith about communication, she listened and he felt understood.  He reports faith has agreed to be more forthcoming with information about finances going forward.  Patient is able to say he was proud of himself for doing this.  Patient speaks about another communication related to putting up the Christmas tree.  He reports Faith and his 2 girls put the tree up while he was at work without him.  Rain states he was "devastated" that he was not part of this activity.  He reports he calmly shared his feelings with Faith who again listened and apologized.  LCSW assessed for patient's consistency with exercise.  Patient states since he has not been able to get outside he is doing yoga 3 times a week with a YouTube video.  LCSW commended him for his follow-through with exercise.  Patient notes the benefits and states he used to do yoga  sometime back finding it "relaxing".  LCSW assessed for reading of self talk literature.  Patient states he failed to read the literature and is unable to state a reason.  He apologizes and reports he will read it today since he is again out of work due to weather.  He also agrees to make some notes about his self messaging between now and next appointment.  LCSW facilitated discussion related to Christmas and New Year's holiday.  Patient states intent to make sure his girls have a "happy and stress-free time".  Patient reports he has not made New Year's resolutions in the past but has decided he wanted to do so this year.  Provided edu on small obtainable goals. LCSW provided information on [employer] related to resolutions.  Patient denies other worries/concerns today. LCSW reviewed poc including scheduling prior to close of session. Pt states appreciation for care.   Suicidal/Homicidal: Nowithout intent/plan  Therapist Response: Pt receptive to care.  Plan: Return again in ~2 weeks.  Diagnosis: Axis I:  MDD, mild   [place] Sink, LCSW 08/14/2021 F

## 2021-08-19 ENCOUNTER — Telehealth (HOSPITAL_COMMUNITY): Payer: Self-pay

## 2021-08-19 NOTE — Telephone Encounter (Signed)
Received a PA for patient's Aripiprazole (Abilify) 5mg  tablet from  S/W [place] Reference ID # [id number] SENT TO PHARMACY FOR REVIEW

## 2021-08-22 ENCOUNTER — Telehealth (HOSPITAL_COMMUNITY): Payer: Self-pay

## 2021-08-22 NOTE — Telephone Encounter (Signed)
DID PA ON PATIENT'S ARIPIPRAZOLE 5MG  TABLET. DENIED UNDER REGULAR MEDICAID. WRITER REDID PA WITH [employer] SUBMITTED FOR REVIEW ON 08/22/21 PA # 08/24/21 S/W CANDACE. DETERMINATION WILL BE SENT TO FAX NUMBER [phone]

## 2021-08-28 ENCOUNTER — Ambulatory Visit (HOSPITAL_COMMUNITY): Payer: Federal, State, Local not specified - Other | Admitting: Licensed Clinical Social Worker

## 2021-08-29 NOTE — Telephone Encounter (Signed)
[employer] [place] [employer] C[employer] APPROVED FOR A QUANTITY OF 90 FOR A 90 DAY SUPPLY   ARIPIPRAZOLE 5MG  TABLET  PA # EFFECTIVE 08/22/2021 TO 08/22/2022

## 2021-09-19 ENCOUNTER — Telehealth (INDEPENDENT_AMBULATORY_CARE_PROVIDER_SITE_OTHER): Payer: Medicaid Other | Admitting: Family

## 2021-09-19 DIAGNOSIS — F411 Generalized anxiety disorder: Secondary | ICD-10-CM | POA: Diagnosis not present

## 2021-09-19 DIAGNOSIS — F330 Major depressive disorder, recurrent, mild: Secondary | ICD-10-CM | POA: Diagnosis not present

## 2021-09-19 DIAGNOSIS — F3161 Bipolar disorder, current episode mixed, mild: Secondary | ICD-10-CM

## 2021-09-19 NOTE — Progress Notes (Addendum)
Virtual Visit via Telephone Note  I connected with Darrell [name] on 09/26/21 at 11:30 AM EST by telephone and verified that I am speaking with the correct person using two identifiers.  Location: Patient: Home Provider: Office   I discussed the limitations, risks, security and privacy concerns of performing an evaluation and management service by telephone and the availability of in person appointments. I also discussed with the patient that there may be a patient responsible charge related to this service. The patient expressed understanding and agreed to proceed.   I discussed the assessment and treatment plan with the patient. The patient was provided an opportunity to ask questions and all were answered. The patient agreed with the plan and demonstrated an understanding of the instructions.   The patient was advised to call back or seek an in-person evaluation if the symptoms worsen or if the condition fails to improve as anticipated.  I provided 15 minutes of non-face-to-face time during this encounter.   [name], NP   [facility] MD/PA/NP OP Progress Note  09/23/2021 2:39 PM Darrell [name]  MRN:  [id number]  Chief Complaint:   "Im having increased anxiety"   HPI: Darrell [name] was evaluated telephonically.  Charted history of bipolar disorder, major depressive disorder, generalized anxiety disorder.  Currently prescribed Abilify and Prozac where he reports taking and tolerating well.  Does report symptoms of worsening anxiety.  Discussed decreasing Prozac from 20 mg to 10 mg.  Will initiate hydroxyzine 25 mg p.o. 3 times daily to help stabilize mood.  Discussed possibly starting mood stabilization medication. [name] reported poor concentration and mood irritability.  Rating his depression 3 out of 10 with 10 being the worst.  Reports interrupted sleep 3-4 nights a week.  We will titrate medications where appropriate.  Patient to follow-up 4 weeks for medication management/efficacy.   Patient was receptive to plan.  Visit Diagnosis:    ICD-10-CM   1. MDD (major depressive disorder), recurrent episode, mild (HCC)  F33.0     2. Anxiety state  F41.1     3. Bipolar disorder, current episode mixed, mild (HCC)  F31.61       Past Psychiatric History:   Past Medical History:  Past Medical History:  Diagnosis Date   Depression    No past surgical history on file.  Family Psychiatric History:   Family History: No family history on file.  Social History:  Social History   Socioeconomic History   Marital status: Significant Other    Spouse name: Not on file   Number of children: Not on file   Years of education: Not on file   Highest education level: Not on file  Occupational History   Not on file  Tobacco Use   Smoking status: Every Day    Packs/day: 1.00    Types: Cigarettes    Passive exposure: Current   Smokeless tobacco: Never  Vaping Use   Vaping Use: Former  Substance and Sexual Activity   Alcohol use: Yes    Comment: Drinks 2-5 days a week   Drug use: Yes    Types: Marijuana    Comment: Uses "off and on"   Sexual activity: Not on file  Other Topics Concern   Not on file  Social History Narrative   Not on file   Social Determinants of Health   Financial Resource Strain: Not on file  Food Insecurity: Not on file  Transportation Needs: Not on file  Physical Activity: Not on file  Stress: Not on file  Social Connections: Not on file    Allergies: No Known Allergies  Metabolic Disorder Labs: No results found for: HGBA1C, MPG No results found for: PROLACTIN No results found for: CHOL, TRIG, HDL, CHOLHDL, VLDL, LDLCALC Lab Results  Component Value Date   TSH 2.210 07/06/2015    Therapeutic Level Labs: No results found for: LITHIUM No results found for: VALPROATE No components found for:  CBMZ  Current Medications: Current Outpatient Medications  Medication Sig Dispense Refill   ARIPiprazole (ABILIFY) 5 MG tablet Take 1  tablet (5 mg total) by mouth daily. 30 tablet 1   cephALEXin (KEFLEX) 500 MG capsule Take 1 capsule (500 mg total) by mouth 4 (four) times daily. 20 capsule 0   FLUoxetine (PROZAC) 20 MG capsule Take 1 capsule (20 mg total) by mouth daily. 30 capsule 1   hydrOXYzine (VISTARIL) 25 MG capsule TAKE 1 CAPSULE BY MOUTH THREE TIMES A DAY AS NEEDED FOR ANXIETY 60 capsule 0   No current facility-administered medications for this visit.     Musculoskeletal:   Psychiatric Specialty Exam: Review of Systems  There were no vitals taken for this visit.There is no height or weight on file to calculate BMI.  General Appearance: NA  Eye Contact:  NA  Speech:  Clear and Coherent  Volume:  Normal  Mood:  Anxious and Depressed  Affect:  Congruent  Thought Process:  Coherent  Orientation:  Full (Time, Place, and Person)  Thought Content: Logical   Suicidal Thoughts:  No  Homicidal Thoughts:  No  Memory:  Immediate;   Good Recent;   Good  Judgement:  Good  Insight:  Good  Psychomotor Activity:  Normal  Concentration:  Concentration: Good  Recall:  Good  Fund of Knowledge: Good  Language: Good  Akathisia:  No  Handed:  Right  AIMS (if indicated): done  Assets:  Communication Skills Desire for Improvement Resilience Social Support  ADL's:  Intact  Cognition: WNL  Sleep:  Good   Screenings: AIMS    Flowsheet Row [facility]  AIMS Total Score 0      AUDIT    Flowsheet Row [facility]  Alcohol Use Disorder Identification Test Final Score (AUDIT) 21      GAD-7    Flow[facility]  Total GAD-7 Score 11      PHQ2-9    Flowsheet Row [facility]  PHQ-2 Total Score 2 2  PHQ-9 Total Score 10 7      Flowsheet Row [facility]  C-SSRS RISK CATEGORY No Risk No Risk Error: Q3, 4, or 5 should not be populated when Q2 is No        Assessment and Plan: Darrell [name] is a 31 year old male that presents for medication management appointment.  He has a charted history of major depressive disorder and generalized anxiety disorder.  Patient is currently prescribed Prozac 20 mg p.o. daily, Abilify 5 mg and was initiated on hydroxyzine.  Reports poor concentration and mood irritability.  Discussed considering mood stabilization medication patient was receptive to plan.   Major  depressive disorder: Generalized anxiety disorder:  Increased Abilify 5 mg to 10 mg daily  Decrease Prozac 20 mg to 10 mg p.o. daily.  Continue to titrate medication where appropriate Initiated hydroxyzine 25 mg p.o. 3 times daily  Follow-up 4 to 6 weeks for medication management  [name], NP 09/23/2021, 2:39 PM

## 2021-09-23 ENCOUNTER — Encounter (HOSPITAL_COMMUNITY): Payer: Self-pay | Admitting: Family

## 2021-09-23 MED ORDER — ARIPIPRAZOLE 10 MG PO TABS: 10.0000 mg | ORAL_TABLET | Freq: Every day | ORAL | 1 refills | Status: DC

## 2021-09-23 MED ORDER — FLUOXETINE HCL 20 MG PO CAPS: 20.0000 mg | ORAL_CAPSULE | Freq: Every day | ORAL | 1 refills | Status: DC

## 2021-09-23 MED ORDER — ARIPIPRAZOLE 5 MG PO TABS: 5.0000 mg | ORAL_TABLET | Freq: Every day | ORAL | 1 refills | Status: DC

## 2021-09-30 ENCOUNTER — Ambulatory Visit (HOSPITAL_COMMUNITY): Payer: Medicaid Other | Admitting: Licensed Clinical Social Worker

## 2021-10-23 ENCOUNTER — Other Ambulatory Visit: Payer: Self-pay

## 2021-10-23 ENCOUNTER — Ambulatory Visit (INDEPENDENT_AMBULATORY_CARE_PROVIDER_SITE_OTHER): Payer: Medicaid Other | Admitting: Licensed Clinical Social Worker

## 2021-10-23 DIAGNOSIS — F330 Major depressive disorder, recurrent, mild: Secondary | ICD-10-CM | POA: Diagnosis not present

## 2021-10-28 NOTE — Progress Notes (Signed)
? ?  THERAPIST PROGRESS NOTE ? ?Session Time: 48 min ? ?Participation Level: Active ? ?Behavioral Response: CasualAlertmildly dep ? ?Type of Therapy: Individual Therapy ? ?Treatment Goals addressed: dep/stressors/coping ? ?ProgressTowards Goals: Progressing ? ?Interventions: CBT, Solution Focused, Supportive, and Reframing ? ?Summary: Darrell [name] is a 31 y.o. male who presents with hx of MDD. Today pt returns for in person session.  Patient last seen August 14, 2021.  LCSW addressed gap in care and assessed for any significant changes.  Patient denies significant changes.  Darrell [name] reports the Christmas and New Year's holidays went well.  LCSW assessed for new year resolutions since patient mentioned he would like to make some this year.  Patient reports the only active plan he has is to make sure he does better at saving money this year. LCSW encouraged plan. LCSW assessed for communication patterns with girlfriend [name].  Maxwel states this continues to be going better.  He advises she is staying consistent with improved communication related to finances.  Additional assessment reveals the couple does not have an active budget together.  Darrell [name] admits he does not really know how to make a budget.  LCSW provided information and referral on [facility]'s free financial planning.  Darrell [name] states he will ask [name] to attend a scheduled appointment with him.  LCSW facilitated role-play on how he would go about that communication.  Darrell [name] is encouraged to go on his own if Darrell [name] is not willing to go with him.  Patient verbalizes agreement.  LCSW assessed for patient's overall mood and coping.  Patient reports he continues to have some struggles with irritability but feels his mood is improved.  [name] provides an example of an irritable out burst at work.  LCSW assisted to problem solve and explore other ways patient could have handled/coped with the situation. LCSW assessed for status of  meds including hydroxyzine. Pt denies have hydroxyzine. He reports he is taking other meds as prescribed.  With patient's agreement LCSW calls [name] pharmacy to discern whether or not patient has an active prescription there.  Representative reports there is an active prescription and agrees to get this prepared for Darrell [name] to pick up today.  LCSW assessed for patient being mindful of his self talk and reading self talk literature provided.  Patient reports he has read the literature and is staying more mindful which he finds helpful in staying more positive/uplifted. He states he has dropped  back on his yoga when asked as he is so active at work he is "exhausted" when he gets home. Pt denies other new worries/concerns. LCSW reviewed poc including scheduling prior to close of session. Pt states appreciation for care.  ? ?Suicidal/Homicidal: Nowithout intent/plan ? ?Therapist Response: Pt receptive to care. ? ?Plan: Return again in ~3 weeks. ? ?Diagnosis: MDD, mild ? ?Collaboration of Care: Community Stakeholder(s) [employer], referral for financial planning at [employer] [facility] [name] ? ?Patient/Guardian was advised Release of Information must be obtained prior to any record release in order to collaborate their care with an outside provider. Patient/Guardian was advised if they have not already done so to contact the registration department to sign all necessary forms in order for Darrell [name] to release information regarding their care.  ? ?Consent: Patient/Guardian gives verbal consent for treatment and assignment of benefits for services provided during this visit. Patient/Guardian expressed understanding and agreed to proceed.  ? ?Darrell Sink, LCSW ?10/28/2021 ? ?

## 2021-11-07 ENCOUNTER — Telehealth (HOSPITAL_COMMUNITY): Payer: Medicaid Other | Admitting: Physician Assistant

## 2021-11-07 ENCOUNTER — Encounter (HOSPITAL_COMMUNITY): Payer: Self-pay

## 2021-11-13 ENCOUNTER — Ambulatory Visit (HOSPITAL_COMMUNITY): Payer: Medicaid Other | Admitting: Licensed Clinical Social Worker

## 2021-11-13 ENCOUNTER — Telehealth (HOSPITAL_COMMUNITY): Payer: Self-pay | Admitting: Licensed Clinical Social Worker

## 2021-11-13 NOTE — Telephone Encounter (Signed)
Administrative staff informed clinician that patient called to cancel his appointment for 9:00 this morning.  LCSW called patient in hopes of speaking with him but call went to voicemail.  LCSW left a detailed message regarding this clinician's resignation from position at [facility] behavioral health. Informed Darrell [name] of transition plan and encouraged him to continue care. Wished him well. ?

## 2021-12-16 ENCOUNTER — Ambulatory Visit (HOSPITAL_COMMUNITY): Payer: Medicaid Other | Admitting: Licensed Clinical Social Worker

## 2022-04-15 ENCOUNTER — Encounter (HOSPITAL_BASED_OUTPATIENT_CLINIC_OR_DEPARTMENT_OTHER): Payer: Self-pay | Admitting: Emergency Medicine

## 2022-04-15 ENCOUNTER — Other Ambulatory Visit: Payer: Self-pay

## 2022-04-15 ENCOUNTER — Emergency Department (HOSPITAL_BASED_OUTPATIENT_CLINIC_OR_DEPARTMENT_OTHER)
Admission: EM | Admit: 2022-04-15 | Discharge: 2022-04-15 | Disposition: A | Discharge status: Home / Self Care | Payer: Medicaid Other | Attending: Emergency Medicine | Admitting: Emergency Medicine

## 2022-04-15 ENCOUNTER — Emergency Department (HOSPITAL_BASED_OUTPATIENT_CLINIC_OR_DEPARTMENT_OTHER): Payer: Medicaid Other | Admitting: Radiology

## 2022-04-15 ENCOUNTER — Other Ambulatory Visit (HOSPITAL_BASED_OUTPATIENT_CLINIC_OR_DEPARTMENT_OTHER): Payer: Self-pay

## 2022-04-15 DIAGNOSIS — S6992XA Unspecified injury of left wrist, hand and finger(s), initial encounter: Secondary | ICD-10-CM

## 2022-04-15 DIAGNOSIS — S61112A Laceration without foreign body of left thumb with damage to nail, initial encounter: Secondary | ICD-10-CM | POA: Insufficient documentation

## 2022-04-15 DIAGNOSIS — S62525B Nondisplaced fracture of distal phalanx of left thumb, initial encounter for open fracture: Secondary | ICD-10-CM | POA: Insufficient documentation

## 2022-04-15 DIAGNOSIS — W232XXA Caught, crushed, jammed or pinched between a moving and stationary object, initial encounter: Secondary | ICD-10-CM | POA: Diagnosis not present

## 2022-04-15 MED ORDER — CEPHALEXIN 500 MG PO CAPS
500.0000 mg | ORAL_CAPSULE | Freq: Four times a day (QID) | ORAL | 0 refills | Status: DC
  Filled 2022-04-15: qty 28, 7d supply, fill #0

## 2022-04-15 MED ORDER — OXYCODONE-ACETAMINOPHEN 5-325 MG PO TABS
1.0000 | ORAL_TABLET | Freq: Once | ORAL | Status: AC
  Administered 2022-04-15: 1 via ORAL
  Filled 2022-04-15: qty 1

## 2022-04-15 MED ORDER — BUPIVACAINE HCL (PF) 0.5 % IJ SOLN
10.0000 mL | Freq: Once | INTRAMUSCULAR | Status: DC
  Filled 2022-04-15: qty 10

## 2022-04-15 MED ORDER — CEPHALEXIN 250 MG PO CAPS
500.0000 mg | ORAL_CAPSULE | Freq: Once | ORAL | Status: AC
  Administered 2022-04-15: 500 mg via ORAL
  Filled 2022-04-15: qty 2

## 2022-04-15 MED ORDER — LIDOCAINE HCL (PF) 1 % IJ SOLN
10.0000 mL | Freq: Once | INTRAMUSCULAR | Status: DC
  Filled 2022-04-15: qty 10

## 2022-04-15 NOTE — ED Provider Notes (Signed)
..  Laceration Repair  Date/Time: 04/15/2022 2:58 PM  Performed by: Gailen Shelter, PA Authorized by: Gailen Shelter, PA   Consent:    Consent obtained:  Verbal   Consent given by:  Patient   Risks discussed:  Infection, need for additional repair, pain, poor cosmetic result and poor wound healing   Alternatives discussed:  No treatment and delayed treatment Universal protocol:    Procedure explained and questions answered to patient or proxy's satisfaction: yes     Relevant documents present and verified: yes     Test results available: yes     Imaging studies available: yes     Required blood products, implants, devices, and special equipment available: yes     Site/side marked: yes     Immediately prior to procedure, a time out was called: yes     Patient identity confirmed:  Verbally with patient Anesthesia:    Anesthesia method:  Nerve block and local infiltration   Block location:  Digital block of left thumb   Block needle gauge:  27 G Laceration details:    Location:  Finger   Finger location:  L thumb   Length (cm):  1.5 Exploration:    Hemostasis achieved with:  Direct pressure and tourniquet   Imaging obtained: x-ray     Imaging outcome: foreign body not noted     Wound exploration: wound explored through full range of motion     Wound extent: no foreign bodies/material noted and no tendon damage noted     Contaminated: no   Treatment:    Area cleansed with:  Saline   Amount of cleaning:  Standard   Irrigation solution:  Sterile saline   Irrigation volume:  100 cc   Irrigation method:  Pressure wash   Visualized foreign bodies/material removed: no   Skin repair:    Repair method:  Sutures   Suture size:  5-0   Wound skin closure material used: Vicryl.   Suture technique:  Simple interrupted   Number of sutures:  4 Approximation:    Approximation:  Close Repair type:    Repair type:  Complex Post-procedure details:    Dressing:  Antibiotic ointment  and non-adherent dressing   Procedure completion:  Tolerated well, no immediate complications Comments:     This was a laceration underneath the thumbnail.  Nail was completely removed and laceration was repaired.  Please see physical exam for picture. .Nail Removal  Date/Time: 04/15/2022 4:11 PM  Performed by: Gailen Shelter, PA Authorized by: Gailen Shelter, PA   Consent:    Consent obtained:  Verbal   Consent given by:  Patient   Risks discussed:  Bleeding, incomplete removal, infection, pain and permanent nail deformity   Alternatives discussed:  No treatment, delayed treatment, alternative treatment and referral Location:    Hand:  L thumb Pre-procedure details:    Skin preparation:  Chlorhexidine Anesthesia:    Anesthesia method:  Nerve block   Block needle gauge:  27 G   Block anesthetic:  Bupivacaine 0.5% w/o epi   Block technique:  Digital Nail Removal:    Nail removed:  Complete   Nail bed repaired: yes     Nail bed repair material:  5-0 vicryl   Number of sutures:  4 Post-procedure details:    Dressing:  Xeroform gauze   Procedure completion:  Shawnie Dapper, PA 04/15/22 1612    Rolan Bucco, MD 04/16/22 [phone]

## 2022-04-15 NOTE — ED Notes (Signed)
ED Provider at bedside. 

## 2022-04-15 NOTE — ED Triage Notes (Signed)
Pt arrives to ED with c/o left thumb injury after shutting the thumb in a car door this morning.

## 2022-04-15 NOTE — ED Provider Notes (Signed)
[facility] EMERGENCY DEPT Provider Note   CSN: [id number] Arrival date & time: 04/15/22  6734     History  Chief Complaint  Patient presents with   Finger Injury    Darrell [name] is a 31 y.o. male.  Patient is a 31 year old male who presents with an injury to his left thumb.  This morning he shut it in a car door.  It displaced the base of the nail.  He has had some constant throbbing pain since then.  His tetanus shot is not up-to-date per his report.  However on chart review, it appears that he received it in October 2022.       Home Medications Prior to Admission medications   Medication Sig Start Date End Date Taking? Authorizing Provider  cephALEXin (KEFLEX) 500 MG capsule Take 1 capsule (500 mg total) by mouth 4 (four) times daily. 04/15/22  Yes Rolan Bucco, MD  ARIPiprazole (ABILIFY) 10 MG tablet Take 1 tablet (10 mg total) by mouth daily. 09/23/21 09/23/22  [name], NP  FLUoxetine (PROZAC) 20 MG capsule Take 1 capsule (20 mg total) by mouth daily. 09/23/21 09/23/22  [name], NP  hydrOXYzine (VISTARIL) 25 MG capsule TAKE 1 CAPSULE BY MOUTH THREE TIMES A DAY AS NEEDED FOR ANXIETY 08/20/21   Nwoko, Tommas Olp, PA  meloxicam (MOBIC) 15 MG tablet Take 15 mg by mouth daily. 03/17/22   [provider]  omeprazole (PRILOSEC) 40 MG capsule Take 40 mg by mouth daily. 03/17/22   [provider]      Allergies    Apple and Mushroom extract complex    Review of Systems   Review of Systems  Constitutional:  Negative for fever.  Gastrointestinal:  Negative for nausea and vomiting.  Musculoskeletal:  Positive for arthralgias and joint swelling. Negative for back pain and neck pain.  Skin:  Positive for wound.  Neurological:  Negative for weakness, numbness and headaches.    Physical Exam Updated Vital Signs BP 132/87   Pulse (!) 58   Temp 98.4 F (36.9 C) (Oral)   Resp 16   Ht 6\' 4"  (1.93 m)   Wt 98 kg   SpO2 100%   BMI  26.29 kg/m  Physical Exam Constitutional:      Appearance: He is well-developed.  HENT:     Head: Normocephalic and atraumatic.  Cardiovascular:     Rate and Rhythm: Normal rate.  Pulmonary:     Effort: Pulmonary effort is normal.  Musculoskeletal:        General: Tenderness present.     Cervical back: Normal range of motion and neck supple.     Comments: Patient has a crush injury to his distal left thumb.  The base of the nail is elevated.  There is small laceration adjacent to the nailbed.  Capillary refill is less than 2 distally.  He has limited ability to flex and extend due to pain.  Skin:    General: Skin is warm and dry.  Neurological:     Mental Status: He is alert and oriented to person, place, and time.      ED Results / Procedures / Treatments   Labs (all labs ordered are listed, but only abnormal results are displayed) Labs Reviewed - No data to display  EKG None  Radiology DG Finger Thumb Left  Result Date: 04/15/2022 CLINICAL DATA:  Crush injury, patient should his left thumb into the car door this morning. EXAM: LEFT THUMB 2+V COMPARISON:  None Available. FINDINGS: There is a mildly displaced avulsion fracture at the tuft of the distal phalanx of the first digit. The osseous fragment measures approximately 0.2 x 1.1 x 0.6 cm. Soft tissue swelling and mild irregularity at the nail bed. IMPRESSION: Mildly displaced avulsion fracture at the tuft of the distal phalanx of the first digit measuring approximately 0.2 x 1.1 x 0.6 cm. Electronically Signed   By: Larose Hires D.O.   On: 04/15/2022 10:03    Procedures Procedures    Medications Ordered in ED Medications  lidocaine (PF) (XYLOCAINE) 1 % injection 10 mL (has no administration in time range)  bupivacaine(PF) (MARCAINE) 0.5 % injection 10 mL (has no administration in time range)  cephALEXin (KEFLEX) capsule 500 mg (500 mg Oral Given 04/15/22 1151)  oxyCODONE-acetaminophen (PERCOCET/ROXICET) 5-325 MG per  tablet 1 tablet (1 tablet Oral Given 04/15/22 1350)    ED Course/ Medical Decision Making/ A&P                           Medical Decision Making Amount and/or Complexity of Data Reviewed Radiology: ordered.  Risk Prescription drug management.   Patient is a 31 year old male who presents with a crush injury to his left thumb.  There is a distal phalanx fracture.  He was started on Keflex and given a first dose in the ED.  The wound was washed out and repaired in the ED.  His tetanus shot is up-to-date.  I did initially speak with Dr. Melvyn Novas who is on-call for hand surgery who request that we fix it in the ED.  It was repaired by Solon Augusta, PA, patient was discharged home in good condition.  He was encouraged to follow-up with Dr. Melvyn Novas for recheck.  Finger splint was placed.  Turn precautions given.  Final Clinical Impression(s) / ED Diagnoses Final diagnoses:  Open nondisplaced fracture of distal phalanx of left thumb, initial encounter  Injury to fingernail of left hand, initial encounter    Rx / DC Orders ED Discharge Orders          Ordered    cephALEXin (KEFLEX) 500 MG capsule  4 times daily        04/15/22 1500              Rolan Bucco, MD 04/15/22 1506

## 2022-04-15 NOTE — Discharge Instructions (Addendum)
Take the antibiotics as discussed.  Make an appointment to follow-up with a hand surgeon as discussed.  Return to the emergency room if you have any worsening symptoms.

## 2023-02-26 ENCOUNTER — Encounter: Payer: Self-pay | Admitting: Cardiology

## 2023-02-26 ENCOUNTER — Ambulatory Visit: Payer: Medicaid Other | Admitting: Cardiology

## 2023-02-26 VITALS — BP 113/75 | HR 83 | Resp 16 | Ht 75.0 in | Wt 240.8 lb

## 2023-02-26 DIAGNOSIS — R9431 Abnormal electrocardiogram [ECG] [EKG]: Secondary | ICD-10-CM

## 2023-02-26 DIAGNOSIS — F3178 Bipolar disorder, in full remission, most recent episode mixed: Secondary | ICD-10-CM

## 2023-02-26 DIAGNOSIS — R0609 Other forms of dyspnea: Secondary | ICD-10-CM

## 2023-02-26 DIAGNOSIS — M7662 Achilles tendinitis, left leg: Secondary | ICD-10-CM

## 2023-02-26 NOTE — Progress Notes (Signed)
Primary Physician/Referring:  Jackie Plum, MD  Patient ID: Darrell [name], male    DOB: Nov 23, 1990, 32 y.o.   MRN: [id number]  Chief Complaint  Patient presents with   Shortness of Breath   HPI:    Darrell [name]  is a 32 y.o. with no significant cardiovascular history, has very mild hypercholesterolemia, history of vaping, bipolar disorder in remission, not on any medication for the past 1 year presents for evaluation of dyspnea.  States that over the past 1 year he has noticed mild dyspnea on exertion but over the past several months he has noticed that his dyspnea is getting gradually worse.  No associated chest pain, dizziness or syncope.  No leg edema.  Past Medical History:  Diagnosis Date   Bipolar disorder (HCC)    Depression    History reviewed. No pertinent surgical history. Family History  Problem Relation Age of Onset   Bipolar disorder Mother    Schizophrenia Mother    Bipolar disorder Sister     Social History   Tobacco Use   Smoking status: Former    Packs/day: 1.00    Years: 10.00    Additional pack years: 0.00    Total pack years: 10.00    Types: Cigarettes    Passive exposure: Current   Smokeless tobacco: Never  Substance Use Topics   Alcohol use: Not Currently    Comment: Quit 2020   Marital Status: Significant Other  ROS  Review of Systems  Cardiovascular:  Positive for dyspnea on exertion. Negative for chest pain and leg swelling.   Objective      02/26/2023   10:14 AM 04/15/2022    3:28 PM 04/15/2022    2:00 PM  Vitals with BMI  Height 6\' 3"     Weight 240 lbs 13 oz    BMI 30.1    Systolic 113 141 413  Diastolic 75 87 87  Pulse 83 53 58   Blood pressure 113/75, pulse 83, resp. rate 16, height 6\' 3"  (1.905 m), weight 240 lb 12.8 oz (109.2 kg), SpO2 98 %.  Physical Exam Neck:     Vascular: No carotid bruit or JVD.  Cardiovascular:     Rate and Rhythm: Normal rate and regular rhythm.     Pulses: Intact distal pulses.      Heart sounds: Normal heart sounds. No murmur heard.    No gallop.  Pulmonary:     Effort: Pulmonary effort is normal.     Breath sounds: Normal breath sounds.  Abdominal:     General: Bowel sounds are normal.     Palpations: Abdomen is soft.  Musculoskeletal:     Right lower leg: No edema.     Left lower leg: No edema.     Laboratory examination:   External labs:   Labs 06/12/2022:  TSH 1.16.  A1c 4.6%.  Hb 14.0/HCT 40.3, platelets 316.  Normal indicis.  Serum glucose 63 mg, BUN 11, creatinine 0.95, EGFR 108 mm, LFTs normal.  Total cholesterol 202, triglycerides 62, HDL 62, LDL 125.  Non-HDL cholesterol 140.  Radiology:    Cardiac Studies:   NA  EKG:   EKG 02/26/2023: Normal sinus rhythm at the rate of 66 bpm, normal axis, nonspecific inferior T wave inversion, cannot exclude ischemia.   EKG 02/18/2023: Normal sinus rhythm at rate of 68 bpm, normal axis, nonspecific inferior T wave abnormality, consider LVH versus ischemia. Medications and allergies   Allergies  Allergen Reactions   Apple Anaphylaxis  Mushroom Extract Complex Anaphylaxis    Medication list   Current Outpatient Medications:    cephALEXin (KEFLEX) 500 MG capsule, Take 1 capsule (500 mg total) by mouth 4 (four) times daily., Disp: 28 capsule, Rfl: 0   meloxicam (MOBIC) 15 MG tablet, Take 15 mg by mouth daily., Disp: , Rfl:    omeprazole (PRILOSEC) 40 MG capsule, Take 40 mg by mouth daily., Disp: , Rfl:    sucralfate (CARAFATE) 1 g tablet, Take 1 g by mouth in the morning, at noon, in the evening, and at bedtime., Disp: , Rfl:    VENTOLIN HFA 108 (90 Base) MCG/ACT inhaler, Inhale 2 puffs into the lungs every 6 (six) hours as needed., Disp: , Rfl:    hydrOXYzine (VISTARIL) 25 MG capsule, TAKE 1 CAPSULE BY MOUTH THREE TIMES A DAY AS NEEDED FOR ANXIETY (Patient not taking: Reported on 02/26/2023), Disp: 60 capsule, Rfl: 0  Assessment     ICD-10-CM   1. Dyspnea on exertion  R06.09 EKG 12-Lead    PCV  ECHOCARDIOGRAM COMPLETE    PCV CARDIAC STRESS TEST    2. Nonspecific abnormal electrocardiogram (ECG) (EKG)  R94.31 PCV ECHOCARDIOGRAM COMPLETE    PCV CARDIAC STRESS TEST    3. Achilles bursitis of left lower extremity  M76.62 Ambulatory referral to Orthopedic Surgery    4. Bipolar disorder, in full remission, most recent episode mixed (HCC)  F31.78 Ambulatory referral to Psychiatry      Orders Placed This Encounter  Procedures   Ambulatory referral to Orthopedic Surgery    Referral Priority:   Routine    Referral Type:   Surgical    Referral Reason:   Specialty Services Required    Requested Specialty:   Orthopedic Surgery    Number of Visits Requested:   1   Ambulatory referral to Psychiatry    Referral Priority:   Routine    Referral Type:   Psychiatric    Referral Reason:   Specialty Services Required    Requested Specialty:   Psychiatry    Number of Visits Requested:   1   PCV CARDIAC STRESS TEST    Standing Status:   Future    Standing Expiration Date:   04/29/2023   EKG 12-Lead   PCV ECHOCARDIOGRAM COMPLETE    Standing Status:   Future    Standing Expiration Date:   02/26/2024   No orders of the defined types were placed in this encounter.  Medications Discontinued During This Encounter  Medication Reason   ARIPiprazole (ABILIFY) 10 MG tablet Patient Preference   FLUoxetine (PROZAC) 20 MG capsule      Recommendations:   Darrell [name] is a 32 y.o. African-American male patient with bipolar disorder, depression, anxiety, reactive airway disease, cigarette smoker referred to me for evaluation of dyspnea on exertion ongoing for the past 1 years.  1. Dyspnea on exertion Patient has noticed mild exertional dyspnea that started about a year ago, states that for the past several months, every time he plays ball he has to stop after running the cord couple times.  No obvious wheezing, no associated chest pain.  Symptoms suggestive exercise-induced asthma.  He is using  albuterol on a as needed basis.  In view of abnormal EKG, will obtain an echocardiogram and a routine treadmill exercise stress test.  My suspicion for coronary disease is low.  Blood pressure is normal as well. - EKG 12-Lead - PCV ECHOCARDIOGRAM COMPLETE; Future - PCV CARDIAC STRESS TEST; Future  2. Nonspecific  abnormal electrocardiogram (ECG) (EKG) Inferior T wave abnormalities noted, unchanged from EKG from PCP. - PCV ECHOCARDIOGRAM COMPLETE; Future - PCV CARDIAC STRESS TEST; Future  3. Achilles bursitis of left lower extremity Patient complains of bursitis in his left lower leg and also right hallux toe has bony abnormality and has been using NSAIDs on a daily basis which advised him not to in view of long-term side effects.  I will make a referral for orthopedic surgery. - Ambulatory referral to Orthopedic Surgery  4. Bipolar disorder, in full remission, most recent episode mixed [facility]) Patient has a diagnosis of mild bipolar disorder, he has not used any of his medications for the past 1 year as he had to go see his psychiatrist every week to every 2 weeks and this was coming in the way of his job.  Presently doing well, but I do believe that he should be followed by psychiatry, he is doing extremely well and is gainfully employed and has completely quit drinking alcohol 4 years ago as he felt that his depression and anxiety will get worse with drinking.  He appears very motivated to completely give up on vaping as well.  I performed treadmill stress test and echocardiogram and see him back at that time and if tests are within normal limits, I may have to consider referral to pulmonary medicine as well.  - Ambulatory referral to Psychiatry  Other orders - sucralfate (CARAFATE) 1 g tablet; Take 1 g by mouth in the morning, at noon, in the evening, and at bedtime. - VENTOLIN HFA 108 (90 Base) MCG/ACT inhaler; Inhale 2 puffs into the lungs every 6 (six) hours as needed.   Yates Decamp, MD,  [facility] 02/26/2023, 10:47 AM Office: [phone]

## 2023-03-04 ENCOUNTER — Ambulatory Visit (INDEPENDENT_AMBULATORY_CARE_PROVIDER_SITE_OTHER): Payer: Medicaid Other | Admitting: Physician Assistant

## 2023-03-04 ENCOUNTER — Other Ambulatory Visit (INDEPENDENT_AMBULATORY_CARE_PROVIDER_SITE_OTHER): Payer: Medicaid Other

## 2023-03-04 ENCOUNTER — Encounter: Payer: Self-pay | Admitting: Physician Assistant

## 2023-03-04 DIAGNOSIS — M79671 Pain in right foot: Secondary | ICD-10-CM

## 2023-03-04 DIAGNOSIS — M79672 Pain in left foot: Secondary | ICD-10-CM | POA: Diagnosis not present

## 2023-03-04 MED ORDER — DICLOFENAC SODIUM 75 MG PO TBEC: 75.0000 mg | DELAYED_RELEASE_TABLET | Freq: Two times a day (BID) | ORAL | 2 refills | Status: DC | PRN

## 2023-03-04 NOTE — Progress Notes (Signed)
Office Visit Note   Patient: Darrell [name]           Date of Birth: 06-09-1991           MRN: [id number] Visit Date: 03/04/2023              Requested by: Yates Decamp, MD [address] [place],  [place] [address] PCP: Jackie Plum, MD   Assessment & Plan: Visit Diagnoses:  1. Pain of left heel   2. 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  In regards to the left heel, I recommended immobilization in a cam boot in addition to topical and oral NSAIDs.  He is agreeable to this plan.  In regards to the right foot, I recommended wearing shoes with a wide toe box.  If the symptoms do not improve on either side over the next several weeks she will follow-up for recheck.  Call with concerns or questions in meantime.  Follow-Up Instructions: Return if symptoms worsen or fail to improve.   Orders:  Orders Placed This Encounter  Procedures   XR Os Calcis Left   XR Foot Complete Right   Meds ordered this encounter  Medications   diclofenac (VOLTAREN) 75 MG EC tablet    Sig: Take 1 tablet (75 mg total) by mouth 2 (two) times daily as needed.    Dispense:  60 tablet    Refill:  2      Procedures: No procedures performed   Clinical Data: No additional findings.   Subjective: Chief Complaint  Patient presents with   Left Ankle - Pain   Right Foot - Pain    HPI patient is a 32 year old gentleman who comes in today with left heel and right foot pain.  Regards to his left heel, he has pain in the retrocalcaneal aspect which has been ongoing for the past 3 months.  No known injury or change in activity.  The pain is constant but worse when he is immobile for long period of time.  He has been wearing an ASO brace which does seem to somewhat help.  He has been taking meloxicam which does not help.  In regards to the right foot, he has pain over the first MTP joint.  Pain is worse with activity.  Meloxicam  does help this pain.  Review of Systems as detailed in HPI.  All others reviewed and are negative.   Objective: Vital Signs: There were no vitals taken for this visit.  Physical Exam well-developed well-nourished gentleman in no acute distress.  Alert and oriented x 3.  Ortho Exam left heel exam reveals moderate tenderness over the distal Achilles.  No pain with ankle range of motion.  No pain to the pre-Achilles bursa.  He is neurovascular intact distally.  Right foot exam shows hallux valgus to the first MTP joint.  Specialty Comments:  No specialty comments available.  Imaging: XR Foot Complete Right  Result Date: 03/04/2023 Hallux valgus at the first MTP joint  XR Os Calcis Left  Result Date: 03/04/2023 X-rays demonstrate a retrocalcaneal spur.  No other acute findings.    PMFS History: Patient Active Problem List   Diagnosis Date Noted   Bipolar disorder, current episode mixed, mild (HCC) 08/05/2021   Anxiety state 08/05/2021   Severe episode of recurrent major depressive disorder, without psychotic features (HCC)    MDD (major depressive disorder), recurrent severe, without  psychosis (HCC) 07/06/2015   Alcohol use disorder, severe, dependence (HCC) 07/03/2015   Past Medical History:  Diagnosis Date   Bipolar disorder (HCC)    Depression     Family History  Problem Relation Age of Onset   Bipolar disorder Mother    Schizophrenia Mother    Bipolar disorder Sister     History reviewed. No pertinent surgical history. Social History   Occupational History   Not on file  Tobacco Use   Smoking status: Former    Current packs/day: 1.00    Average packs/day: 1 pack/day for 10.0 years (10.0 ttl pk-yrs)    Types: Cigarettes    Passive exposure: Current   Smokeless tobacco: Never  Vaping Use   Vaping status: Every Day   Substances: Nicotine  Substance and Sexual Activity   Alcohol use: Not Currently    Comment: Quit 2020   Drug use: Yes    Types: Marijuana     Comment: Uses "off and on"   Sexual activity: Not on file

## 2023-03-26 ENCOUNTER — Other Ambulatory Visit: Payer: Medicaid Other

## 2023-04-20 ENCOUNTER — Ambulatory Visit: Payer: Medicaid Other | Admitting: Cardiology

## 2023-04-21 ENCOUNTER — Ambulatory Visit (INDEPENDENT_AMBULATORY_CARE_PROVIDER_SITE_OTHER): Payer: Medicaid Other | Admitting: Psychiatry

## 2023-04-21 ENCOUNTER — Encounter (HOSPITAL_COMMUNITY): Payer: Self-pay | Admitting: Psychiatry

## 2023-04-21 VITALS — BP 129/76 | HR 90 | Temp 98.6°F | Ht 75.0 in | Wt 235.0 lb

## 2023-04-21 DIAGNOSIS — F17200 Nicotine dependence, unspecified, uncomplicated: Secondary | ICD-10-CM

## 2023-04-21 DIAGNOSIS — F1011 Alcohol abuse, in remission: Secondary | ICD-10-CM | POA: Diagnosis not present

## 2023-04-21 DIAGNOSIS — F1994 Other psychoactive substance use, unspecified with psychoactive substance-induced mood disorder: Secondary | ICD-10-CM

## 2023-04-21 DIAGNOSIS — F1290 Cannabis use, unspecified, uncomplicated: Secondary | ICD-10-CM

## 2023-04-21 MED ORDER — FLUOXETINE HCL 10 MG PO CAPS: 10.0000 mg | ORAL_CAPSULE | Freq: Every day | ORAL | 0 refills | Status: AC

## 2023-04-21 NOTE — Addendum Note (Signed)
Addended by: Tia Masker on: 04/21/2023 04:08 PM   Modules accepted: Level of Service

## 2023-04-21 NOTE — Progress Notes (Addendum)
Psychiatric Initial Adult Assessment  Patient Identification: Darrell [name] MRN:  [id number] Date of Evaluation:  04/21/2023 Referral Source: PCP  Assessment:  Darrell [name] is a 32 y.o. male with a history of Bipolar disorder who presents in person to [facility] for initial evaluation of medication management.  Patient reports to have symptoms of decreased sleep and increased energy which would last for multiple days but this would always be in the context of alcohol use so at this time is unlikely that he has a diagnosis of bipolar disorder but more so of a substance induced mood disorder.  Patient reports worsening depression in the context of daily marijuana use over the past 3 months over various stressors in his life including work, transportation, and his relationships.  Reported fair response from Prozac and Abilify in the past but has not taken psychiatric medications for over a year.  We will start patient on Prozac 10 mg at this time for depression and will follow-up for symptomatic improvement in 4 to 6 weeks.  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, GAD) Past medication trials: Abilify, Prozac Status of problem: uncontrolled Interventions: -- Start Prozac 10 mg oral daily, 30 day supply, no refill -- Follow-up in 4-6 weeks  # Marijuana Use Disorder Status of problem: uncontrolled Interventions: -- encourage abstinence  # Alcohol Use Disorder in Sustained Remission Status of problem: controlled -- Encouraged continued abstinence  # Tobacco Use Disorder Status of problem: uncontrolled -- Encourage decreasing use -- Did not want nicotine replacement at this time  Patient was given contact information for behavioral health clinic and was instructed to call 911 for emergencies.   Subjective:  Chief Complaint:  Chief Complaint  Patient presents with   Depression   Anxiety   Stress    History of Present Illness:   Patient has  been experiencing periods of low mood and anger for the past 3 months.  Patient has been having stress about work, transportation, and his relationship with his fiance.  He reports having poor sleep, getting about 3 to 5 hours each night.  He notes going to bed around 8 PM and waking up at 4 and unable to fall back to sleep.  He reports feelings of guilt and hopelessness, poor energy, poor concentration, poor appetite, and psychomotor retardation.  He denies SI.  He notes that in his past, he would feel emotions strongly and he would cope by drinking alcohol.  However he did noticed that after drinking alcohol he would be very depressed.  He reports stopping alcohol use 4 years ago.  He reports having moments of anger and denies lashing out.  He notes internalizing this anger and shuts down. Denies HI. Denies AVH. Denies paranoia. Denies symptoms of OCD and eating disorder.  Past Psychiatric History:  Diagnoses: Bipolar disorder Medication trials: Abilify and Prozac 10 mg- off meds for a year. Abilify helped with sleep. Prozac helped with mood. Was on for 1.5 year. Previous psychiatrist/therapist: Most recently 2022 psychiatrist, saw therapist 2022, seeing for 8 months Hospitalizations: 2019- for alcoholism, bipolar Suicide attempts: Denies SIB: Denies Hx of violence towards others: Denies Current access to guns: Denies Hx of trauma/abuse: yes, has repressed emotion about it  Substance Abuse History in the last 12 months:  Yes.  , marijuana use 1 gram daily   Past Medical History:  Past Medical History:  Diagnosis Date   Depression    No past surgical history on file.  Family History:  Family History  Problem Relation Age of Onset   Bipolar disorder Mother    Schizophrenia Mother    Bipolar disorder Sister     Social History:   Living: [place], lives with fiance and 2 daughters (7 and 4) School: some college Job: Holiday representative Married/Children: fiance Support: states there is not  really anybody he goes to for help Smoking: 3 cigars daily for the past 3 weeks, vaping previously Alcohol: 4 years ago, drinking 80 oz daily for 5 years Illicit drugs: marijuana daily; previously tried cocaine, unprescribed vyvanse Legal History: attempt to traffic marijuana   Social History   Socioeconomic History   Marital status: Single    Spouse name: Not on file   Number of children: Not on file   Years of education: Not on file   Highest education level: Not on file  Occupational History   Not on file  Tobacco Use   Smoking status: Every Day    Types: Cigars    Passive exposure: Current   Smokeless tobacco: Never  Vaping Use   Vaping status: Every Day   Substances: Nicotine  Substance and Sexual Activity   Alcohol use: Not Currently    Comment: Quit 2020   Drug use: Yes    Types: Marijuana    Comment: Uses "off and on"   Sexual activity: Not on file  Other Topics Concern   Not on file  Social History Narrative   Not on file   Social Determinants of Health   Financial Resource Strain: Not on file  Food Insecurity: Not on file  Transportation Needs: Not on file  Physical Activity: Not on file  Stress: Not on file  Social Connections: Not on file    Additional Social History: updated  Allergies:   Allergies  Allergen Reactions   Apple Anaphylaxis   Mushroom Extract Complex Anaphylaxis    Current Medications: Current Outpatient Medications  Medication Sig Dispense Refill   FLUoxetine (PROZAC) 10 MG capsule Take 1 capsule (10 mg total) by mouth daily. 30 capsule 0   No current facility-administered medications for this visit.    ROS: Review of Systems  Constitutional:  Positive for appetite change. Negative for fever.  Respiratory:  Negative for chest tightness.   Gastrointestinal:  Positive for abdominal pain.  Endocrine: Positive for heat intolerance.  Neurological:  Negative for tremors.     Objective:  Psychiatric Specialty Exam: Blood  pressure 129/76, pulse 90, temperature 98.6 F (37 C), height 6\' 3"  (1.905 m), weight 235 lb (106.6 kg), SpO2 98%.Body mass index is 29.37 kg/m.  General Appearance: Casual  Eye Contact:  Good  Speech:  Clear and Coherent and Normal Rate  Volume:  Normal  Mood:  Depressed  Affect:  Congruent and Full Range  Thought Content: Logical   Suicidal Thoughts:  No  Homicidal Thoughts:  No  Thought Process:  Coherent and Linear  Orientation:  Full (Time, Place, and Person)    Memory:  Grossly intact   Judgment:  Fair  Insight:  Good  Concentration:  Concentration: Good and Attention Span: Good  Recall:  not formally assessed   Fund of Knowledge: Good  Language: Good  Psychomotor Activity:  Normal  Akathisia:  No  AIMS (if indicated): not done  Assets:  Communication Skills Desire for Improvement Housing Intimacy Leisure Time Physical Health Resilience Social Support Transportation  ADL's:  Intact  Cognition: WNL  Sleep:  Poor   PE: General: well-appearing; no acute distress  Pulm: no increased work of breathing on room air  Strength & Muscle Tone: within normal limits Neuro: no focal neurological deficits observed  Gait & Station:   Metabolic Disorder Labs: No results found for: "HGBA1C", "MPG" No results found for: "PROLACTIN" No results found for: "CHOL", "TRIG", "HDL", "CHOLHDL", "VLDL", "LDLCALC" Lab Results  Component Value Date   TSH 2.210 07/06/2015    Therapeutic Level Labs: No results found for: "LITHIUM" No results found for: "CBMZ" No results found for: "VALPROATE"  Screenings:  AIMS    Flowsheet Row [facility]  AIMS Total Score 0      AUDIT    Flowsheet Row [facility]  Alcohol Use Disorder Identification Test Final Score (AUDIT) 21      GAD-7    Flowsheet Row Office Visit from 04/21/2023 in  [facility] Office Visit from 08/05/2021 in [facility]  Total GAD-7 Score 12 11      PHQ2-9    Flowsheet Row Office Visit from 04/21/2023 in [facility] Office Visit from 08/05/2021 in [facility] Counselor from 05/27/2021 in [facility]  PHQ-2 Total Score 2 2 2   PHQ-9 Total Score 10 10 7       Flowsheet Row ED from 04/15/2022 in [facility] at [facility] Office Visit from 08/05/2021 in [facility] ED from 06/17/2021 in [facility] Emergency Department at [facility]  C-SSRS RISK CATEGORY No Risk No Risk No Risk       Collaboration of Care: Collaboration of Care: Primary Care Provider AEB    Patient was advised Release of Information must be obtained prior to any record release in order to collaborate their care with an outside provider. Patient/Guardian was advised if they have not already done so to contact the registration department to sign all necessary forms in order for [name] to release information regarding their care.   Consent: Patient gives verbal consent for treatment and assignment of benefits for services provided during this visit. Patient expressed understanding and agreed to proceed.   A total of 40 minutes was spent involved in face to face clinical care, chart review, documentation, and discussing medications.   Lance Muss, MD 8/28/20243:35 PM

## 2023-04-21 NOTE — Patient Instructions (Addendum)
Take Prozac 10 mg Daily. F/u in 4-6 weeks.

## 2023-05-21 ENCOUNTER — Encounter (HOSPITAL_BASED_OUTPATIENT_CLINIC_OR_DEPARTMENT_OTHER): Payer: Self-pay | Admitting: Emergency Medicine

## 2023-05-21 ENCOUNTER — Other Ambulatory Visit: Payer: Self-pay

## 2023-05-21 DIAGNOSIS — M791 Myalgia, unspecified site: Secondary | ICD-10-CM | POA: Insufficient documentation

## 2023-05-21 DIAGNOSIS — Z20822 Contact with and (suspected) exposure to covid-19: Secondary | ICD-10-CM | POA: Insufficient documentation

## 2023-05-21 DIAGNOSIS — R109 Unspecified abdominal pain: Secondary | ICD-10-CM | POA: Insufficient documentation

## 2023-05-21 DIAGNOSIS — F17200 Nicotine dependence, unspecified, uncomplicated: Secondary | ICD-10-CM | POA: Insufficient documentation

## 2023-05-21 LAB — RESP PANEL BY RT-PCR (RSV, FLU A&B, COVID)  RVPGX2
Influenza A by PCR: NEGATIVE
Influenza B by PCR: NEGATIVE
Resp Syncytial Virus by PCR: NEGATIVE
SARS Coronavirus 2 by RT PCR: NEGATIVE

## 2023-05-21 NOTE — ED Triage Notes (Addendum)
  Patient comes in with generalized body aches that have been going on for 3 days.  Patient states he feels sore all over and has had URI symptoms.  Patient took covid test at home and was negative.  Recently exposed to someone with similar symptoms.  Pain 6/10.  Prescribed amoxicillin, tessalon, and sertraline about 4 days ago.

## 2023-05-22 ENCOUNTER — Emergency Department (HOSPITAL_BASED_OUTPATIENT_CLINIC_OR_DEPARTMENT_OTHER)
Admission: EM | Admit: 2023-05-22 | Discharge: 2023-05-22 | Disposition: A | Discharge status: Home / Self Care | Payer: Medicaid Other | Attending: Emergency Medicine | Admitting: Emergency Medicine

## 2023-05-22 DIAGNOSIS — R109 Unspecified abdominal pain: Secondary | ICD-10-CM

## 2023-05-22 MED ORDER — ONDANSETRON 4 MG PO TBDP: 4.0000 mg | ORAL_TABLET | Freq: Three times a day (TID) | ORAL | 0 refills | Status: AC | PRN

## 2023-05-22 MED ORDER — ALUM & MAG HYDROXIDE-SIMETH 200-200-20 MG/5ML PO SUSP
30.0000 mL | Freq: Once | ORAL | Status: AC
  Administered 2023-05-22: 30 mL via ORAL
  Filled 2023-05-22: qty 30

## 2023-05-22 MED ORDER — LIDOCAINE VISCOUS HCL 2 % MT SOLN
15.0000 mL | Freq: Once | OROMUCOSAL | Status: AC
  Administered 2023-05-22: 15 mL via ORAL
  Filled 2023-05-22: qty 15

## 2023-05-22 MED ORDER — ONDANSETRON 4 MG PO TBDP
4.0000 mg | ORAL_TABLET | Freq: Once | ORAL | Status: AC
  Administered 2023-05-22: 4 mg via ORAL
  Filled 2023-05-22: qty 1

## 2023-05-22 NOTE — ED Provider Notes (Signed)
[place] EMERGENCY DEPARTMENT AT [facility] Provider Note  CSN: [id number] Arrival date & time: 05/21/23 1939  Chief Complaint(s) Generalized Body Aches  Patient comes in with generalized body aches that have been going on for 3 days.  Patient states he feels sore all over and has had URI symptoms.  Patient took covid test at home and was negative.  Recently exposed to someone with similar symptoms.  Pain 6/10.  Prescribed amoxicillin, tessalon, and sertraline about 4 days ago.    HPI Darrell [name] is a 32 y.o. male here for several days of generalized bodyaches with URI symptoms.  Negative COVID test at home.  Seen by his primary care provider and prescribed amoxicillin, Tessalon Perles for possible sinusitis.  Also complaining of several years of intermittent postprandial abdominal discomfort and bloating.  Exacerbated with certain foods.  No current abdominal pain, but feeling bloated.  No associated nausea w/o vomiting. Having loose stools.  No other physical complaints.  The history is provided by the patient.    Past Medical History Past Medical History:  Diagnosis Date   Depression    Patient Active Problem List   Diagnosis Date Noted   Substance induced mood disorder (HCC) 04/21/2023   Marijuana use disorder 04/21/2023   Alcohol use disorder, mild, in sustained remission 04/21/2023   Tobacco use disorder 04/21/2023   Home Medication(s) Prior to Admission medications   Medication Sig Start Date End Date Taking? Authorizing Provider  ondansetron (ZOFRAN-ODT) 4 MG disintegrating tablet Take 1 tablet (4 mg total) by mouth every 8 (eight) hours as needed for up to 3 days. 05/22/23 05/25/23 Yes Netasha Wehrli, Amadeo Garnet, MD  FLUoxetine (PROZAC) 10 MG capsule Take 1 capsule (10 mg total) by mouth daily. 04/21/23 05/21/23  Lance Muss, MD                                                                                                                                     Allergies Apple and Mushroom extract complex  Review of Systems Review of Systems As noted in HPI  Physical Exam Vital Signs  I have reviewed the triage vital signs BP 133/87 (BP Location: Right Arm)   Pulse 65   Temp 98.6 F (37 C)   Resp 18   Ht 6\' 3"  (1.905 m)   Wt 106.6 kg   SpO2 100%   BMI 29.37 kg/m   Physical Exam Vitals reviewed.  Constitutional:      General: He is not in acute distress.    Appearance: He is well-developed. He is not diaphoretic.  HENT:     Head: Normocephalic and atraumatic.     Right Ear: External ear normal.     Left Ear: External ear normal.     Nose: Nose normal.     Mouth/Throat:     Mouth: Mucous membranes are moist.  Eyes:     General: No scleral icterus.  Conjunctiva/sclera: Conjunctivae normal.  Neck:     Trachea: Phonation normal.  Cardiovascular:     Rate and Rhythm: Normal rate and regular rhythm.  Pulmonary:     Effort: Pulmonary effort is normal. No respiratory distress.     Breath sounds: No stridor.  Abdominal:     General: There is no distension.     Tenderness: There is no abdominal tenderness. There is no guarding or rebound.  Musculoskeletal:        General: Normal range of motion.     Cervical back: Normal range of motion.  Neurological:     Mental Status: He is alert and oriented to person, place, and time.  Psychiatric:        Behavior: Behavior normal.     ED Results and Treatments Labs (all labs ordered are listed, but only abnormal results are displayed) Labs Reviewed  RESP PANEL BY RT-PCR (RSV, FLU A&B, COVID)  [id number]                                                                                                                         EKG  EKG Interpretation Date/Time:    Ventricular Rate:    PR Interval:    QRS Duration:    QT Interval:    QTC Calculation:   R Axis:      Text Interpretation:         Radiology No results found.  Medications Ordered in ED Medications  alum & mag  hydroxide-simeth (MAALOX/MYLANTA) 200-200-20 MG/5ML suspension 30 mL (30 mLs Oral Given 05/22/23 0153)    And  lidocaine (XYLOCAINE) 2 % viscous mouth solution 15 mL (15 mLs Oral Given 05/22/23 0152)  ondansetron (ZOFRAN-ODT) disintegrating tablet 4 mg (4 mg Oral Given 05/22/23 0153)   Procedures Procedures  (including critical care time) Medical Decision Making / ED Course   Medical Decision Making Risk OTC drugs. Prescription drug management.    Patient currently being treated for upper respiratory infection with Tessalon Perles and amoxicillin.  Abdominal discomfort has preceded the antibiotics.  Timeframe not concerning for C. difficile infection.  Abdomen is benign and unlikely serious intra-abdominal inflammatory/infectious processes such as pancreatitis, cholecystitis, enterocolitis. No abdominal distention or tenderness concerning for bowel obstruction. Clinical picture does not favor blood work or imaging at this time. Respiratory viral was obtained in triage and negative for COVID, influenza, RSV.   Patient provided with GI cocktail and Zofran for bloating symptoms and nausea.  Tolerated p.o.     Final Clinical Impression(s) / ED Diagnoses Final diagnoses:  Abdominal discomfort   The patient appears reasonably screened and/or stabilized for discharge and I doubt any other medical condition or other [facility] requiring further screening, evaluation, or treatment in the ED at this time. I have discussed the findings, Dx and Tx plan with the patient/family who expressed understanding and agree(s) with the plan. Discharge instructions discussed at length. The patient/family was given strict return precautions who verbalized understanding of the instructions.  No further questions at time of discharge.  Disposition: Discharge  Condition: Good  ED Discharge Orders          Ordered    ondansetron (ZOFRAN-ODT) 4 MG disintegrating tablet  Every 8 hours PRN        05/22/23 0135             Follow Up: Jackie Plum, MD [address] [place] [address] [phone]  Call  to schedule an appointment for close follow up     This chart was dictated using voice recognition software.  Despite best efforts to proofread,  errors can occur which can change the documentation meaning.    Nira Conn, MD 05/22/23 [phone]

## 2023-06-03 ENCOUNTER — Ambulatory Visit (HOSPITAL_COMMUNITY): Payer: Self-pay | Admitting: Licensed Clinical Social Worker

## 2023-06-15 NOTE — Progress Notes (Deleted)
Psychiatric Adult Assessment Progress Note  Patient Identification: Darrell [name] MRN:  [id number] Date of Evaluation:  06/15/2023 Referral Source: PCP  Assessment:  Darrell [name] is a 32 y.o. male with a history of SIMD who presents in person to [facility] for follow-up evaluation of medication management.  We started patient on Prozac 10 mg during the last patient visit for depression. Today, ***  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, GAD) Past medication trials: Abilify, Prozac Status of problem: uncontrolled Interventions: -- *** Prozac 10 mg oral daily -- Follow-up in 4-6 weeks  # Marijuana Use Disorder Status of problem: uncontrolled Interventions: -- encourage abstinence  # Alcohol Use Disorder in Sustained Remission Status of problem: controlled -- Encouraged continued abstinence  # Tobacco Use Disorder Status of problem: uncontrolled -- Encourage decreasing use -- Did not want nicotine replacement at this time  Patient was given contact information for behavioral health clinic and was instructed to call 911 for emergencies.   Subjective:  Chief Complaint:  No chief complaint on file.   History of Present Illness:   Darrell [name] is a 32 y.o. male with a history of SIMD who presents in person to [facility] for follow-up evaluation of medication management.  We started patient on Prozac 10 mg during the last patient visit for depression. ***  Past Psychiatric History:  Diagnoses: Substance induced mood disorder Medication trials: Abilify and Prozac 10 mg- off meds for a year. Abilify helped with sleep. Prozac helped with mood. Was on for 1.5 year. Previous psychiatrist/therapist: prior to last visit,Most recently 2022 psychiatrist, saw therapist 2022, seeing for 8 months Hospitalizations: 2019- for alcoholism, concern for bipolar Suicide attempts: Denies SIB: Denies Hx of violence towards  others: Denies Current access to guns: Denies Hx of trauma/abuse: yes, has repressed emotion about it  Substance Abuse History in the last 12 months:  Yes.  , marijuana use 1 gram daily ***  Past Medical History:  Past Medical History:  Diagnosis Date   Depression    No past surgical history on file.   Family History:  Family History  Problem Relation Age of Onset   Bipolar disorder Mother    Schizophrenia Mother    Bipolar disorder Sister     Social History:   Living: [place], lives with fiance and 2 daughters (7 and 4) School: some college Job: Holiday representative Married/Children: fiance Support: states there is not really anybody he goes to for help Smoking: 3 cigars daily ***, vaping previously Alcohol: 4 years ago, previously drank 80 oz daily for 5 years Illicit drugs: marijuana daily***; previously tried cocaine, unprescribed vyvanse Legal History: attempt to traffic marijuana   Social History   Socioeconomic History   Marital status: Single    Spouse name: Not on file   Number of children: Not on file   Years of education: Not on file   Highest education level: Not on file  Occupational History   Not on file  Tobacco Use   Smoking status: Every Day    Types: Cigars    Passive exposure: Current   Smokeless tobacco: Never  Vaping Use   Vaping status: Every Day   Substances: Nicotine  Substance and Sexual Activity   Alcohol use: Not Currently    Comment: Quit 2020   Drug use: Yes    Types: Marijuana    Comment: Uses "off and on"   Sexual activity: Not on file  Other Topics Concern  Not on file  Social History Narrative   Not on file   Social Determinants of Health   Financial Resource Strain: Not on file  Food Insecurity: Not on file  Transportation Needs: Not on file  Physical Activity: Not on file  Stress: Not on file  Social Connections: Not on file     Allergies:   Allergies  Allergen Reactions   Apple Anaphylaxis   Mushroom Extract Complex  Anaphylaxis    Current Medications: Current Outpatient Medications  Medication Sig Dispense Refill   FLUoxetine (PROZAC) 10 MG capsule Take 1 capsule (10 mg total) by mouth daily. 30 capsule 0   No current facility-administered medications for this visit.    ROS: Review of Systems  Constitutional:  Positive for appetite change. Negative for fever.  Respiratory:  Negative for chest tightness.   Gastrointestinal:  Positive for abdominal pain.  Endocrine: Positive for heat intolerance.  Neurological:  Negative for tremors.     Objective:  Psychiatric Specialty Exam: There were no vitals taken for this visit.There is no height or weight on file to calculate BMI.  General Appearance: Casual  Eye Contact:  Good  Speech:  Clear and Coherent and Normal Rate  Volume:  Normal  Mood:  Depressed  Affect:  Congruent and Full Range  Thought Content: Logical   Suicidal Thoughts:  No  Homicidal Thoughts:  No  Thought Process:  Coherent and Linear  Orientation:  Full (Time, Place, and Person)    Memory:  Grossly intact   Judgment:  Fair  Insight:  Good  Concentration:  Concentration: Good and Attention Span: Good  Recall:  not formally assessed   Fund of Knowledge: Good  Language: Good  Psychomotor Activity:  Normal  Akathisia:  No  AIMS (if indicated): not done  Assets:  Communication Skills Desire for Improvement Housing Intimacy Leisure Time Physical Health Resilience Social Support Transportation  ADL's:  Intact  Cognition: WNL  Sleep:  Poor   PE: General: well-appearing; no acute distress  Pulm: no increased work of breathing on room air  Strength & Muscle Tone: within normal limits Neuro: no focal neurological deficits observed  Gait & Station:   Metabolic Disorder Labs: No results found for: "HGBA1C", "MPG" No results found for: "PROLACTIN" No results found for: "CHOL", "TRIG", "HDL", "CHOLHDL", "VLDL", "LDLCALC" Lab Results  Component Value Date   TSH  2.210 07/06/2015    Therapeutic Level Labs: No results found for: "LITHIUM" No results found for: "CBMZ" No results found for: "VALPROATE"  Screenings:  AIMS    Flowsheet Row [facility]  AIMS Total Score 0      AUDIT    Flowsheet Row [facility]  Alcohol Use Disorder Identification Test Final Score (AUDIT) 21      GAD-7    Flowsheet Row Office Visit from 04/21/2023 in [facility] Office Visit from 08/05/2021 in [facility]  Total GAD-7 Score 12 11      PHQ2-9    Flowsheet Row Office Visit from 04/21/2023 in [facility] Office Visit from 08/05/2021 in [facility] Counselor from 05/27/2021 in [facility]  PHQ-2 Total Score 2 2 2   PHQ-9 Total Score 10 10 7       Flowsheet Row ED from 05/22/2023 in [facility] Emergency Department at [facility] ED from 04/15/2022  in [facility] Emergency Department at [facility] Visit from 08/05/2021 in [facility]  C-SSRS RISK CATEGORY No Risk No Risk No Risk        Patient was advised Release of Information must be obtained prior to any record release in order to collaborate their care with an outside provider. Patient/Guardian was advised if they have not already done so to contact the registration department to sign all necessary forms in order for [name] to release information regarding their care.   Consent: Patient gives verbal consent for treatment and assignment of benefits for services provided during this visit. Patient expressed understanding and agreed to proceed.   A total of 40 minutes was spent involved in face to face clinical care, chart review, documentation, and discussing medications.    Lance Muss, MD 10/22/20244:09 PM

## 2023-06-16 ENCOUNTER — Encounter (HOSPITAL_COMMUNITY): Payer: Self-pay | Admitting: Psychiatry

## 2023-07-15 ENCOUNTER — Ambulatory Visit (HOSPITAL_COMMUNITY): Payer: Self-pay | Admitting: Licensed Clinical Social Worker

## 2023-07-15 ENCOUNTER — Encounter (HOSPITAL_COMMUNITY): Payer: Self-pay

## 2023-08-16 IMAGING — DX DG HAND COMPLETE 3+V*R*
1 series · 3 of 3 positions shown · non-contrast
Comparison: December 19, 2016.

CLINICAL DATA: Right index finger injury.

EXAM:
RIGHT HAND - COMPLETE 3+ VIEW

[Series 1: hand · 0.14mm/px · 3 of 3 slices shown]
[im 1/3]
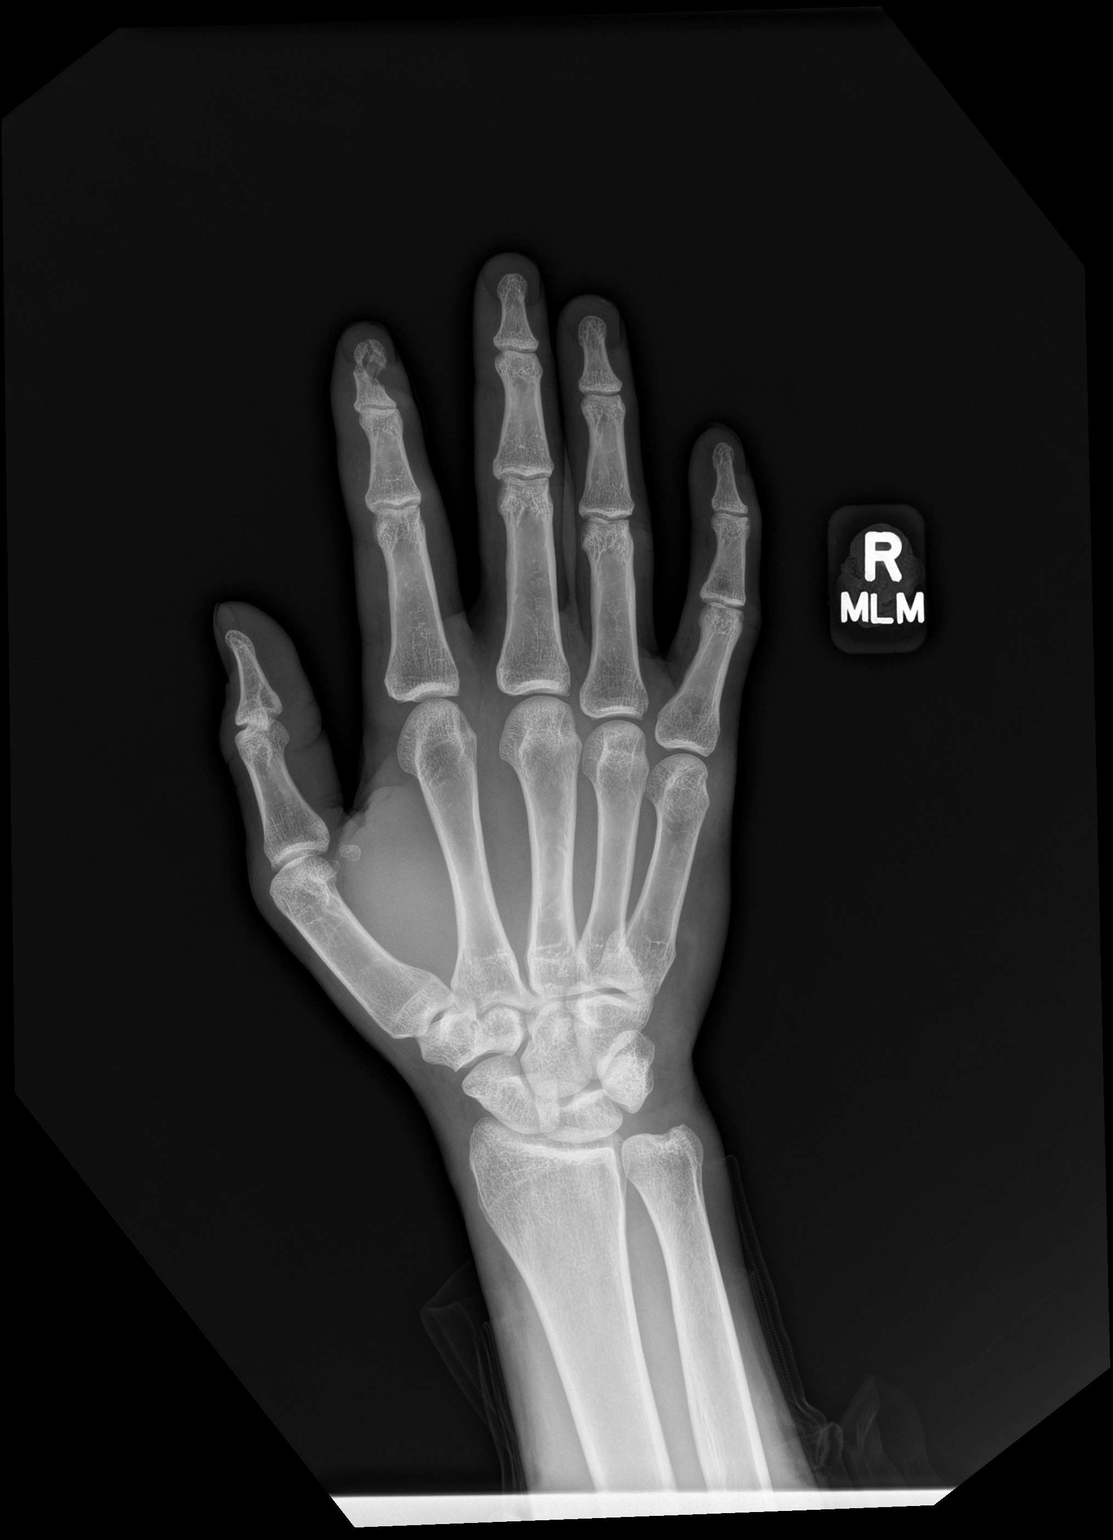
[im 2/3]
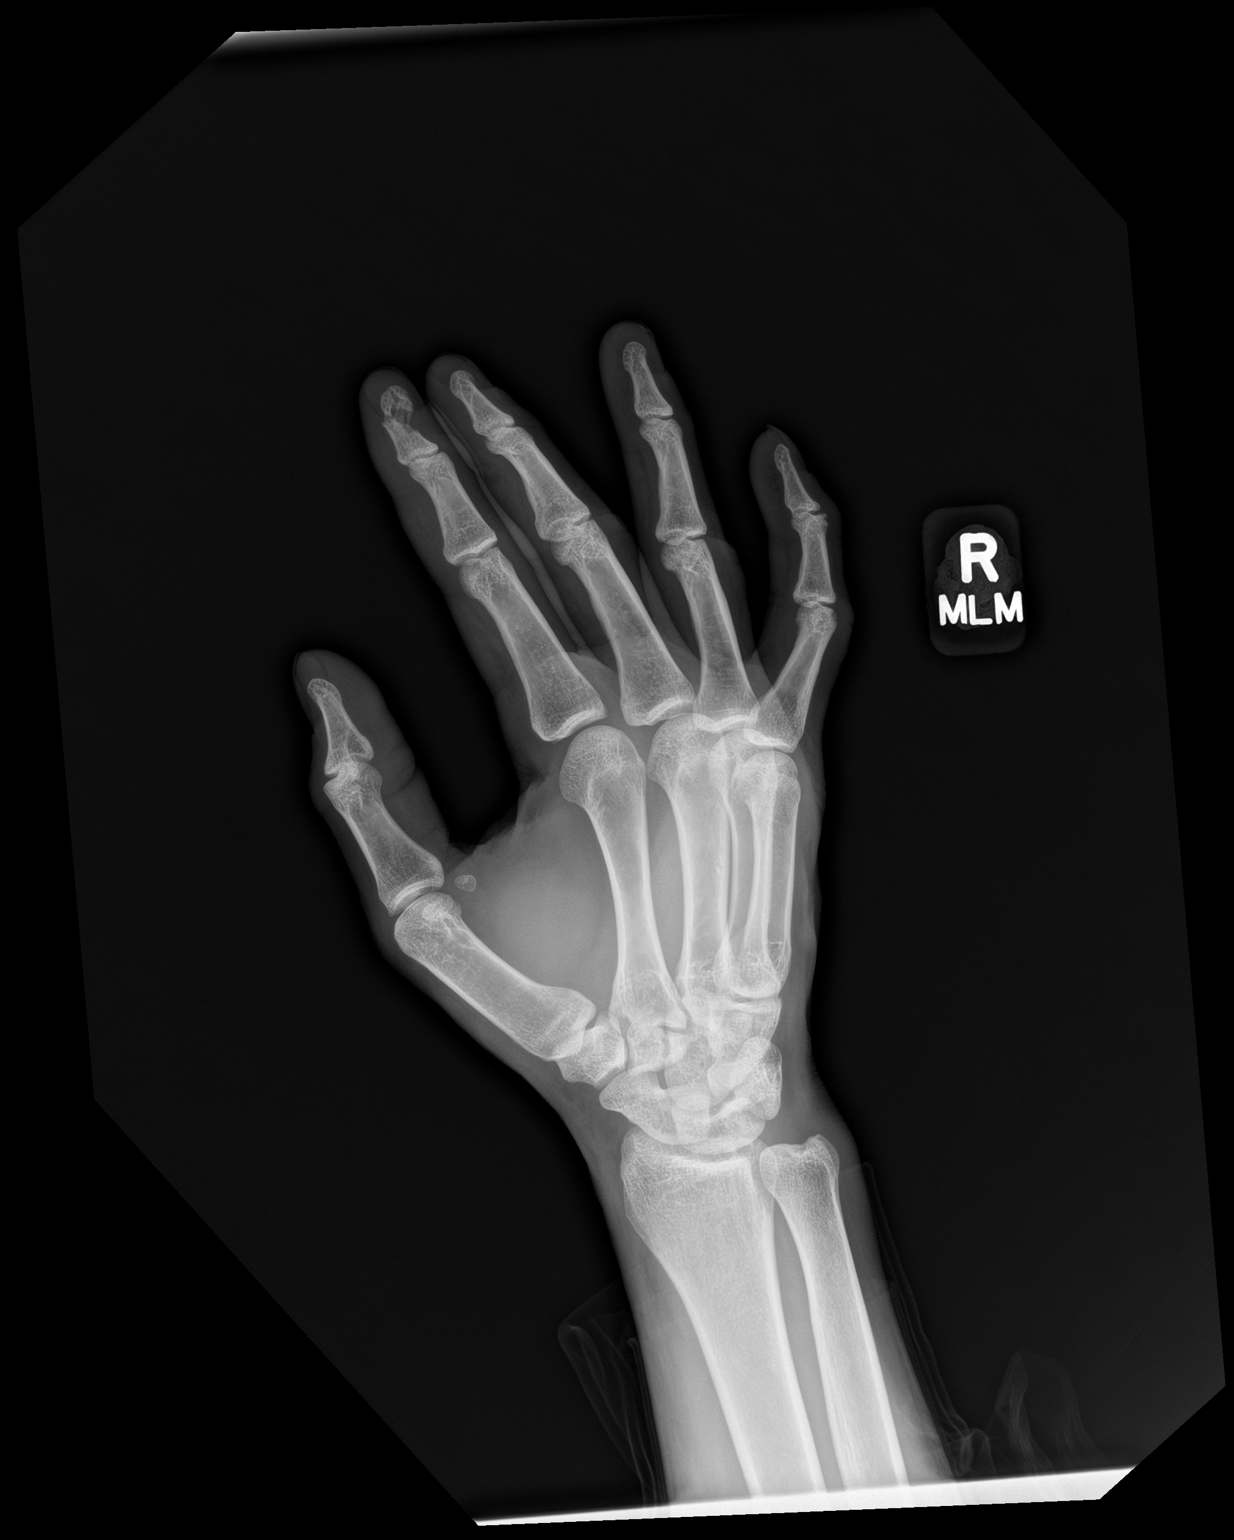
[im 3/3]
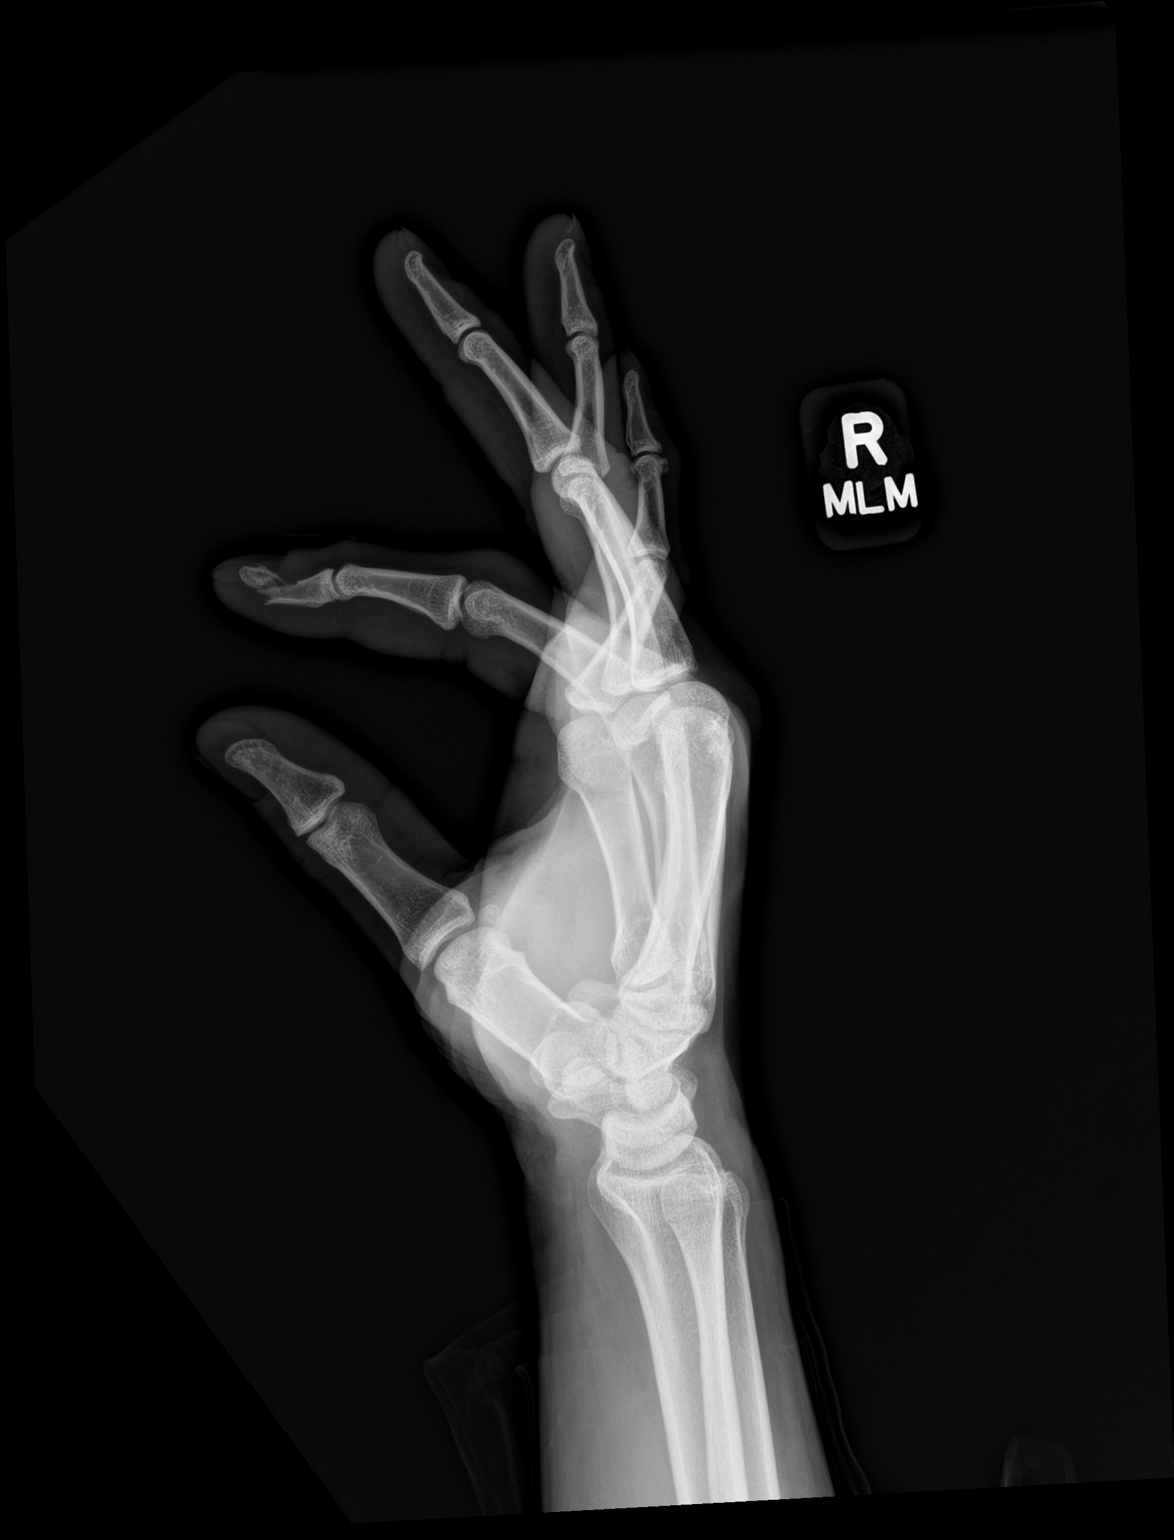

[3 of 3 positions shown; findings below may reference images not displayed]

FINDINGS: Moderately displaced and comminuted fracture is seen involving the
second distal phalanx. Joint spaces are intact.
IMPRESSION: Moderately displaced and comminuted second distal phalangeal
fracture.

## 2024-03-14 ENCOUNTER — Encounter (HOSPITAL_BASED_OUTPATIENT_CLINIC_OR_DEPARTMENT_OTHER): Payer: Self-pay | Admitting: Emergency Medicine

## 2024-03-14 ENCOUNTER — Other Ambulatory Visit: Payer: Self-pay

## 2024-03-14 ENCOUNTER — Emergency Department (HOSPITAL_BASED_OUTPATIENT_CLINIC_OR_DEPARTMENT_OTHER): Admitting: Radiology

## 2024-03-14 DIAGNOSIS — J069 Acute upper respiratory infection, unspecified: Secondary | ICD-10-CM | POA: Diagnosis not present

## 2024-03-14 DIAGNOSIS — R059 Cough, unspecified: Secondary | ICD-10-CM | POA: Diagnosis present

## 2024-03-14 LAB — COMPREHENSIVE METABOLIC PANEL WITH GFR
ALT: 18 U/L (ref 0–44)
AST: 30 U/L (ref 15–41)
Albumin: 4.6 g/dL (ref 3.5–5.0)
Alkaline Phosphatase: 72 U/L (ref 38–126)
Anion gap: 14 (ref 5–15)
BUN: 14 mg/dL (ref 6–20)
CO2: 20 mmol/L — ABNORMAL LOW (ref 22–32)
Calcium: 9.8 mg/dL (ref 8.9–10.3)
Chloride: 103 mmol/L (ref 98–111)
Creatinine, Ser: 1.33 mg/dL — ABNORMAL HIGH (ref 0.61–1.24)
GFR, Estimated: >60 mL/min (ref >60)
Glucose, Bld: 131 mg/dL — ABNORMAL HIGH (ref 70–99)
Potassium: 3.7 mmol/L (ref 3.5–5.1)
Sodium: 137 mmol/L (ref 135–145)
Total Bilirubin: 0.7 mg/dL (ref 0.0–1.2)
Total Protein: 7.6 g/dL (ref 6.5–8.1)

## 2024-03-14 LAB — LIPASE, BLOOD: Lipase: 56 U/L — ABNORMAL HIGH (ref 11–51)

## 2024-03-14 LAB — CBC
HCT: 40.5 % (ref 39.0–52.0)
Hemoglobin: 13.9 g/dL (ref 13.0–17.0)
MCH: 31 pg (ref 26.0–34.0)
MCHC: 34.3 g/dL (ref 30.0–36.0)
MCV: 90.4 fL (ref 80.0–100.0)
Platelets: 358 K/uL (ref 150–400)
RBC: 4.48 MIL/uL (ref 4.22–5.81)
RDW: 11.8 % (ref 11.5–15.5)
WBC: 5.7 K/uL (ref 4.0–10.5)
nRBC: 0 % (ref 0.0–0.2)

## 2024-03-14 LAB — RESP PANEL BY RT-PCR (RSV, FLU A&B, COVID)  RVPGX2
Influenza A by PCR: NEGATIVE
Influenza B by PCR: NEGATIVE
Resp Syncytial Virus by PCR: NEGATIVE
SARS Coronavirus 2 by RT PCR: NEGATIVE

## 2024-03-14 NOTE — ED Triage Notes (Signed)
 Sob x 1 week  Coughing worse at night Feels like he is getting worse Some nausea

## 2024-03-15 ENCOUNTER — Emergency Department (HOSPITAL_BASED_OUTPATIENT_CLINIC_OR_DEPARTMENT_OTHER)
Admission: EM | Admit: 2024-03-15 | Discharge: 2024-03-15 | Disposition: A | Discharge status: Home / Self Care | Attending: Emergency Medicine | Admitting: Emergency Medicine

## 2024-03-15 DIAGNOSIS — J069 Acute upper respiratory infection, unspecified: Secondary | ICD-10-CM

## 2024-03-15 LAB — URINALYSIS, ROUTINE W REFLEX MICROSCOPIC
Bacteria, UA: NONE SEEN
Bilirubin Urine: NEGATIVE
Glucose, UA: NEGATIVE mg/dL
Ketones, ur: NEGATIVE mg/dL
Leukocytes,Ua: NEGATIVE
Nitrite: NEGATIVE
Protein, ur: NEGATIVE mg/dL
Specific Gravity, Urine: 1.019 (ref 1.005–1.030)
pH: 6 (ref 5.0–8.0)

## 2024-03-15 MED ORDER — BENZONATATE 100 MG PO CAPS: 100.0000 mg | ORAL_CAPSULE | Freq: Three times a day (TID) | ORAL | 0 refills | Status: AC | PRN

## 2024-03-15 MED ORDER — PREDNISONE 20 MG PO TABS: 40.0000 mg | ORAL_TABLET | Freq: Every day | ORAL | 0 refills | Status: AC

## 2024-03-15 MED ORDER — ALBUTEROL SULFATE HFA 108 (90 BASE) MCG/ACT IN AERS
2.0000 | INHALATION_SPRAY | RESPIRATORY_TRACT | Status: DC | PRN
  Administered 2024-03-15: 2 via RESPIRATORY_TRACT
  Filled 2024-03-15: qty 6.7

## 2024-03-15 MED ORDER — PREDNISONE 50 MG PO TABS
60.0000 mg | ORAL_TABLET | Freq: Once | ORAL | Status: AC
  Administered 2024-03-15: 60 mg via ORAL
  Filled 2024-03-15: qty 1

## 2024-03-15 MED ORDER — AEROCHAMBER PLUS FLO-VU MISC
1.0000 | Freq: Once | Status: AC
Start: 2024-03-15 — End: 2024-03-15
  Administered 2024-03-15: 1
  Filled 2024-03-15: qty 1

## 2024-03-15 NOTE — ED Notes (Signed)
 Patient provided with inhaler and spacer. Indications of use discussed with patient. Correct spacer usage demonstrated with patient. Patient able to perform independently.

## 2024-03-15 NOTE — ED Provider Notes (Signed)
 [place] EMERGENCY DEPARTMENT AT [facility] Provider Note   CSN: [id number] Arrival date & time: 03/14/24  2043     Patient presents with: Shortness of Breath and Nausea   Darrell [name] is a 33 y.o. male.   Patient presents to the emergency department for evaluation of cough and shortness of breath.  Symptoms present for 1 week.  He reports that cough is worse at night, wakes up feeling short of breath and like he is wheezing.  He has had nausea but no vomiting or diarrhea.  No documented fevers.       Prior to Admission medications   Medication Sig Start Date End Date Taking? Authorizing Provider  benzonatate  (TESSALON ) 100 MG capsule Take 1 capsule (100 mg total) by mouth 3 (three) times daily as needed for cough. 03/15/24  Yes Dyllan Kats, Lonni PARAS, MD  predniSONE  (DELTASONE ) 20 MG tablet Take 2 tablets (40 mg total) by mouth daily with breakfast. 03/15/24  Yes Demiana Crumbley, Lonni PARAS, MD  FLUoxetine  (PROZAC ) 10 MG capsule Take 1 capsule (10 mg total) by mouth daily. 04/21/23 05/21/23  Izella Ismael NOVAK, MD    Allergies: Apple and Mushroom extract complex (obsolete)    Review of Systems  Updated Vital Signs BP 121/82   Pulse (!) 59   Temp 98.8 F (37.1 C) (Oral)   Resp 17   SpO2 98%   Physical Exam Vitals and nursing note reviewed.  Constitutional:      General: He is not in acute distress.    Appearance: He is well-developed.  HENT:     Head: Normocephalic and atraumatic.     Mouth/Throat:     Mouth: Mucous membranes are moist.  Eyes:     General: Vision grossly intact. Gaze aligned appropriately.     Extraocular Movements: Extraocular movements intact.     Conjunctiva/sclera: Conjunctivae normal.  Cardiovascular:     Rate and Rhythm: Normal rate and regular rhythm.     Pulses: Normal pulses.     Heart sounds: Normal heart sounds, S1 normal and S2 normal. No murmur heard.    No friction rub. No gallop.  Pulmonary:     Effort: Pulmonary effort is  normal. No respiratory distress.     Breath sounds: Normal breath sounds.  Abdominal:     Palpations: Abdomen is soft.     Tenderness: There is no abdominal tenderness. There is no guarding or rebound.     Hernia: No hernia is present.  Musculoskeletal:        General: No swelling.     Cervical back: Full passive range of motion without pain, normal range of motion and neck supple. No pain with movement, spinous process tenderness or muscular tenderness. Normal range of motion.     Right lower leg: No edema.     Left lower leg: No edema.  Skin:    General: Skin is warm and dry.     Capillary Refill: Capillary refill takes less than 2 seconds.     Findings: No ecchymosis, erythema, lesion or wound.  Neurological:     Mental Status: He is alert and oriented to person, place, and time.     GCS: GCS eye subscore is 4. GCS verbal subscore is 5. GCS motor subscore is 6.     Cranial Nerves: Cranial nerves 2-12 are intact.     Sensory: Sensation is intact.     Motor: Motor function is intact. No weakness or abnormal muscle tone.  Coordination: Coordination is intact.  Psychiatric:        Mood and Affect: Mood normal.        Speech: Speech normal.        Behavior: Behavior normal.     (all labs ordered are listed, but only abnormal results are displayed) Labs Reviewed  LIPASE, BLOOD - Abnormal; Notable for the following components:      Result Value   Lipase 56 (*)    All other components within normal limits  COMPREHENSIVE METABOLIC PANEL WITH GFR - Abnormal; Notable for the following components:   CO2 20 (*)    Glucose, Bld 131 (*)    Creatinine, Ser 1.33 (*)    All other components within normal limits  RESP PANEL BY RT-PCR (RSV, FLU A&B, COVID)  RVPGX2  CBC  URINALYSIS, ROUTINE W REFLEX MICROSCOPIC    EKG: None  Radiology: DG Chest 2 View Result Date: 03/14/2024 CLINICAL DATA:  sob, cough sob, cough EXAM: CHEST - 2 VIEW COMPARISON:  None Available. FINDINGS: The heart  and mediastinal contours are within normal limits. No focal consolidation. No pulmonary edema. No pleural effusion. No pneumothorax. No acute osseous abnormality. IMPRESSION: No active cardiopulmonary disease. Electronically Signed   By: Morgane  Naveau M.D.   On: 03/14/2024 21:09     Procedures   Medications Ordered in the ED  predniSONE  (DELTASONE ) tablet 60 mg (has no administration in time range)  albuterol  (VENTOLIN  HFA) 108 (90 Base) MCG/ACT inhaler 2 puff (has no administration in time range)                                    Medical Decision Making Amount and/or Complexity of Data Reviewed Labs: ordered. Decision-making details documented in ED Course. Radiology: ordered and independent interpretation performed. Decision-making details documented in ED Course.   Differential Diagnosis considered includes, but not limited to: COVID-19; influenza; RSV; simple viral URI; strep pharyngitis; pneumonia  Patient appears well.  He is breathing comfortably with normal room air oxygen saturations.  Lungs are clear currently, no wheezing.  X-ray without signs of pneumonia.  Lab work unremarkable.  Will treat for viral URI with bronchospasm.     Final diagnoses:  Upper respiratory tract infection, unspecified type    ED Discharge Orders          Ordered    predniSONE  (DELTASONE ) 20 MG tablet  Daily with breakfast        03/15/24 0121    benzonatate  (TESSALON ) 100 MG capsule  3 times daily PRN        03/15/24 0121               Jase Himmelberger J, MD 03/15/24 0121
